# Patient Record
Sex: Female | Born: 1985 | Race: White | Hispanic: No | Smoking: Never smoker
Health system: Southern US, Community
[De-identification: ages and names within clinical notes are randomized; demographics above are authoritative.]

## PROBLEM LIST (undated history)

## (undated) DIAGNOSIS — M1990 Unspecified osteoarthritis, unspecified site: Secondary | ICD-10-CM

## (undated) DIAGNOSIS — J45909 Unspecified asthma, uncomplicated: Secondary | ICD-10-CM

## (undated) DIAGNOSIS — T7840XA Allergy, unspecified, initial encounter: Secondary | ICD-10-CM

## (undated) HISTORY — DX: Allergy, unspecified, initial encounter: T78.40XA

## (undated) HISTORY — DX: Unspecified asthma, uncomplicated: J45.909

## (undated) HISTORY — DX: Unspecified osteoarthritis, unspecified site: M19.90

---
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
## 2014-09-30 ENCOUNTER — Ambulatory Visit (INDEPENDENT_AMBULATORY_CARE_PROVIDER_SITE_OTHER): Payer: BLUE CROSS/BLUE SHIELD | Admitting: Family Medicine

## 2014-09-30 ENCOUNTER — Ambulatory Visit (INDEPENDENT_AMBULATORY_CARE_PROVIDER_SITE_OTHER): Payer: BLUE CROSS/BLUE SHIELD

## 2014-09-30 VITALS — BP 128/74 | HR 82 | Temp 98.1°F | Resp 16 | Ht 67.5 in | Wt 250.0 lb

## 2014-09-30 DIAGNOSIS — Z23 Encounter for immunization: Secondary | ICD-10-CM | POA: Diagnosis not present

## 2014-09-30 DIAGNOSIS — Z283 Underimmunization status: Secondary | ICD-10-CM | POA: Diagnosis not present

## 2014-09-30 DIAGNOSIS — S90411A Abrasion, right great toe, initial encounter: Secondary | ICD-10-CM

## 2014-09-30 DIAGNOSIS — S90111A Contusion of right great toe without damage to nail, initial encounter: Secondary | ICD-10-CM

## 2014-09-30 DIAGNOSIS — S90414A Abrasion, right lesser toe(s), initial encounter: Secondary | ICD-10-CM

## 2014-09-30 DIAGNOSIS — S90121A Contusion of right lesser toe(s) without damage to nail, initial encounter: Secondary | ICD-10-CM

## 2014-09-30 DIAGNOSIS — M79674 Pain in right toe(s): Secondary | ICD-10-CM

## 2014-09-30 DIAGNOSIS — Z2839 Other underimmunization status: Secondary | ICD-10-CM

## 2014-09-30 NOTE — Patient Instructions (Signed)
Keep wound covered with a Band-Aid. Return if any concern of infection.  You have received a Td AP vaccine today  Return if problems  Stay off work today.

## 2014-09-30 NOTE — Progress Notes (Signed)
  Subjective:  Patient ID: Donna Meyer, female    DOB: 02-19-1986  Age: 29 y.o. MRN: [id number]  Patient dropped a 35 pound bucket of litter on her right large toe last night. She had just gotten out of her car. It is painful and she has a hard time flexing the toe fully. It was fine before this. She does not know when her last tetanus vaccine was. Objective:   Small abrasion at the dorsum of the right first MTP joint. Skin was broken a tiny bit. Wound is clean. Toe is in line and looks okay. There is some bruising over the MTP she has a difficult time flexing the toe but does have some motion of it.  [facility] reading (PRIMARY) by  Dr. Alwyn Ren No fracture.    Assessment & Plan:   Assessment: Contusion and pain right great toe with small abrasion  Plan: There are no Patient Instructions on file for this visit.   HOPPER,DAVID, MD 09/30/2014

## 2014-11-25 ENCOUNTER — Ambulatory Visit (INDEPENDENT_AMBULATORY_CARE_PROVIDER_SITE_OTHER): Payer: BLUE CROSS/BLUE SHIELD | Admitting: Family Medicine

## 2014-11-25 VITALS — BP 116/82 | HR 71 | Temp 99.0°F | Resp 18 | Ht 68.0 in | Wt 251.0 lb

## 2014-11-25 DIAGNOSIS — R112 Nausea with vomiting, unspecified: Secondary | ICD-10-CM | POA: Diagnosis not present

## 2014-11-25 LAB — POCT CBC
Granulocyte percent: 70.6 %G (ref 37–80)
HCT, POC: 40.2 % (ref 37.7–47.9)
Hemoglobin: 12.7 g/dL (ref 12.2–16.2)
LYMPH, POC: 2.5 (ref 0.6–3.4)
MCH, POC: 25.6 pg — AB (ref 27–31.2)
MCHC: 31.5 g/dL — AB (ref 31.8–35.4)
MCV: 81.2 fL (ref 80–97)
MID (cbc): 0.5 (ref 0–0.9)
MPV: 7.2 fL (ref 0–99.8)
POC Granulocyte: 7.3 — AB (ref 2–6.9)
POC LYMPH %: 24.4 % (ref 10–50)
POC MID %: 5 %M (ref 0–12)
Platelet Count, POC: 409 10*3/uL (ref 142–424)
RBC: 4.95 M/uL (ref 4.04–5.48)
RDW, POC: 13.9 %
WBC: 10.3 10*3/uL — AB (ref 4.6–10.2)

## 2014-11-25 LAB — POCT UA - MICROSCOPIC ONLY
BACTERIA, U MICROSCOPIC: NEGATIVE
CASTS, UR, LPF, POC: NEGATIVE
Crystals, Ur, HPF, POC: NEGATIVE
Epithelial cells, urine per micros: NEGATIVE
RBC, urine, microscopic: NEGATIVE
WBC, Ur, HPF, POC: NEGATIVE
Yeast, UA: NEGATIVE

## 2014-11-25 LAB — POCT URINALYSIS DIPSTICK
Bilirubin, UA: NEGATIVE
GLUCOSE UA: NEGATIVE
KETONES UA: NEGATIVE
LEUKOCYTES UA: NEGATIVE
NITRITE UA: NEGATIVE
PH UA: 5.5
Protein, UA: NEGATIVE
Spec Grav, UA: 1.025
Urobilinogen, UA: 0.2

## 2014-11-25 LAB — POCT URINE PREGNANCY: PREG TEST UR: NEGATIVE

## 2014-11-25 MED ORDER — ONDANSETRON HCL 8 MG PO TABS: 8.0000 mg | ORAL_TABLET | Freq: Three times a day (TID) | ORAL | Status: DC | PRN

## 2014-11-25 NOTE — Progress Notes (Signed)
[facility] [address] [place] [address] [phone]  Date:  11/25/2014   Name:  Donna Meyer   DOB:  1985-11-11   MRN:  [id number]  PCP:  No PCP Per Patient    Chief Complaint: Nausea; Headache; Nasal Congestion; and Fatigue   History of Present Illness:  Donna Meyer is a 29 y.o. very pleasant female patient who presents with the following:  Generally healthy lady here today with complaint of illness for 2 days She has felt nauseated and vomited a couple of times She does not have any abd pain, but her body does ache  Just the thought or the smell of food makes her feel ill She has also noted an intermittent HA- yesterday it was worse, not severe today She has also felt tired, and has a ST No cough unless she has more PND Occasional sneeze She has her menses now- does not suspect pregnancy  She is a plumbing specialist at [employer] There are no active problems to display for this patient.   History reviewed. No pertinent past medical history.  Past Surgical History  Procedure Laterality Date  . Knee surgery Right 2004    Social History  Substance Use Topics  . Smoking status: Never Smoker   . Smokeless tobacco: None  . Alcohol Use: None    Family History  Problem Relation Age of Onset  . Cancer Mother   . Heart disease Father   . Cancer Maternal Grandmother   . Cancer Paternal Grandmother   . Cancer Paternal Grandfather   . Mental illness Paternal Grandfather     Allergies  Allergen Reactions  . Vicodin [Hydrocodone-Acetaminophen]     Medication list has been reviewed and updated.  No current outpatient prescriptions on file prior to visit.   No current facility-administered medications on file prior to visit.    Review of Systems:  As per HPI- otherwise negative.   Physical Examination: Filed Vitals:   11/25/14 1301  BP: 116/82  Pulse: 71  Temp: 99 F (37.2 C)  Resp: 18   Filed Vitals:   11/25/14  1301  Height:  (1.727 m)  Weight: 251 lb (113.853 kg)   Body mass index is 38.17 kg/(m^2). Ideal Body Weight: Weight in (lb) to have BMI = 25: 164.1  GEN: WDWN, NAD, Non-toxic, A & O x 3, overweight, looks well HEENT: Atraumatic, Normocephalic. Neck supple. No masses, No LAD.   Bilateral TM wnl, oropharynx normal.  PEERL,EOMI.   Ears and Nose: No external deformity. CV: RRR, No M/G/R. No JVD. No thrill. No extra heart sounds. PULM: CTA B, no wheezes, crackles, rhonchi. No retractions. No resp. distress. No accessory muscle use. ABD: S, ND, +BS. No rebound. No HSM.  Minimal lower abd tenderness across belly.  She had not noted any discomfort here until exam EXTR: No c/c/e NEURO Normal gait.  PSYCH: Normally interactive. Conversant. Not depressed or anxious appearing.  Calm demeanor.   Results for orders placed or performed in visit on 11/25/14  POCT CBC  Result Value Ref Range   WBC 10.3 (A) 4.6 - 10.2 K/uL   Lymph, poc 2.5 0.6 - 3.4   POC LYMPH PERCENT 24.4 10 - 50 %L   MID (cbc) 0.5 0 - 0.9   POC MID % 5.0 0 - 12 %M   POC Granulocyte 7.3 (A) 2 - 6.9   Granulocyte percent 70.6 37 - 80 %G   RBC 4.95 4.04 - 5.48 M/uL  Hemoglobin 12.7 12.2 - 16.2 g/dL   HCT, POC 16.1 09.6 - 47.9 %   MCV 81.2 80 - 97 fL   MCH, POC 25.6 (A) 27 - 31.2 pg   MCHC 31.5 (A) 31.8 - 35.4 g/dL   RDW, POC 04.5 %   Platelet Count, POC 409 142 - 424 K/uL   MPV 7.2 0 - 99.8 fL  POCT urine pregnancy  Result Value Ref Range   Preg Test, Ur Negative Negative  POCT UA - Microscopic Only  Result Value Ref Range   WBC, Ur, HPF, POC Negatvie    RBC, urine, microscopic Negative    Bacteria, U Microscopic Negatvie    Mucus, UA Trace    Epithelial cells, urine per micros Negative    Crystals, Ur, HPF, POC Negative    Casts, Ur, LPF, POC Negatvie    Yeast, UA Negative   POCT urinalysis dipstick  Result Value Ref Range   Color, UA Yellow    Clarity, UA Clear    Glucose, UA Negative    Bilirubin, UA  Negative    Ketones, UA Negatvie    Spec Grav, UA 1.025    Blood, UA Moderate    pH, UA 5.5    Protein, UA Negative    Urobilinogen, UA 0.2    Nitrite, UA Negatvie    Leukocytes, UA Negative Negative    Assessment and Plan: Non-intractable vomiting with nausea, vomiting of unspecified type - Plan: POCT CBC, POCT urine pregnancy, POCT UA - Microscopic Only, POCT urinalysis dipstick, ondansetron (ZOFRAN) 8 MG tablet  Here today with malaise, headache, nausea for about 2 days.  Likely non specificviral gastroenteritis.  Noted to have mild abd tenderness on exam- she had not noted any tenderness prior.  However she does endorse a history of painful ovarian cysts in the past  Counseled that it is possible that she has a more serious illness- appendicitis is even possible but do not strongly suspect this.  Offered to send for a CT scan now- she declines due to concern of radiation, but does agree to come back to clinic or go to the ER right away if she is getting worse or if not better soon  Signed Abbe Amsterdam, MD

## 2014-11-25 NOTE — Patient Instructions (Addendum)
Use the zofran as needed for nausea.  Stick to a bland diet  Let us know if you are not feeling better- if you do have any persistent pain please see [place] or go to the ER right away!

## 2015-07-19 ENCOUNTER — Ambulatory Visit (INDEPENDENT_AMBULATORY_CARE_PROVIDER_SITE_OTHER): Payer: BLUE CROSS/BLUE SHIELD | Admitting: Physician Assistant

## 2015-07-19 VITALS — BP 104/68 | HR 87 | Temp 98.4°F | Resp 16 | Ht 68.0 in | Wt 260.2 lb

## 2015-07-19 DIAGNOSIS — S93402A Sprain of unspecified ligament of left ankle, initial encounter: Secondary | ICD-10-CM | POA: Diagnosis not present

## 2015-07-19 NOTE — Patient Instructions (Addendum)
Rest, apply ice, elevate your foot Take ibuprofen three times a day Take off work the next 3 days. If symptoms are not improving in 1 week or if symptoms worsen at any time, return for follow up.    IF you received an x-ray today, you will receive an invoice from [facility]. Please contact [facility] at [phone] with questions or concerns regarding your invoice.   IF you received labwork today, you will receive an invoice from [facility]. Please contact [facility] at [phone] with questions or concerns regarding your invoice.   Our billing staff will not be able to assist you with questions regarding bills from these companies.  You will be contacted with the lab results as soon as they are available. The fastest way to get your results is to activate your My Chart account. Instructions are located on the last page of this paperwork. If you have not heard from us regarding the results in 2 weeks, please contact this office.

## 2015-07-19 NOTE — Progress Notes (Signed)
[facility] [address], [place] [address] [phone]  Date:  07/19/2015   Name:  Donna Meyer   DOB:  Mar 18, 1985   MRN:  [id number]  PCP:  No PCP Per Patient    Chief Complaint: Joint Swelling   History of Present Illness:  This is a 30 y.o. female who is presenting with 3 days of left ankle swelling. NKI. Started after she worked a 13 hour shift at [employer]. 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 Pain has worsened over the past day. She is limping a little. Feels like a sharp pain going from lateral side of ankle to medial side. She has no pain with rest, just walking. Having swelling at medial ankle. She has not tried anything for the pain. Pt states when she was in high school she had a "massive sprain" in her left ankle. She commonly sprains her ankle since that time. She states this is the first time she has had ankle swelling without an injury.  Review of Systems:  Review of Systems See HPI  There are no active problems to display for this patient.   Prior to Admission medications   Not on File    Allergies  Allergen Reactions  . Vicodin [Hydrocodone-Acetaminophen]     Past Surgical History  Procedure Laterality Date  . Knee surgery Right 2004    Social History  Substance Use Topics  . Smoking status: Never Smoker   . Smokeless tobacco: None  . Alcohol Use: None    Family History  Problem Relation Age of Onset  . Cancer Mother   . Heart disease Father   . Cancer Maternal Grandmother   . Cancer Paternal Grandmother   . Cancer Paternal Grandfather   . Mental illness Paternal Grandfather     Medication list has been reviewed and updated.  Physical Examination:  Physical Exam  Constitutional: She is oriented to person, place, and time. She appears well-developed and well-nourished. No distress.  HENT:  Head: Normocephalic and atraumatic.  Right Ear: Hearing normal.  Left Ear: Hearing normal.   Nose: Nose normal.  Eyes: Conjunctivae and lids are normal. Right eye exhibits no discharge. Left eye exhibits no discharge. No scleral icterus.  Cardiovascular: Normal rate, regular rhythm and normal pulses.   Pulmonary/Chest: Effort normal. No respiratory distress.  Musculoskeletal: Normal range of motion.       Left ankle: She exhibits swelling (mild, medial ankle). She exhibits normal range of motion, no ecchymosis and normal pulse. Tenderness (inferior to medial malleolus over soft tissue). No lateral malleolus, no head of 5th metatarsal and no proximal fibula tenderness found. Achilles tendon normal.  Cap refill intact Pedal pulses 2+ bilaterally  Neurological: She is alert and oriented to person, place, and time.  Skin: Skin is warm, dry and intact. No lesion and no rash noted.  Psychiatric: She has a normal mood and affect. Her speech is normal and behavior is normal. Thought content normal.   BP 104/68 mmHg  Pulse 87  Temp(Src) 98.4 F (36.9 C) (Oral)  Resp 16  Ht  (1.727 m)  Wt 260 lb 3.2 oz (118.026 kg)  BMI 39.57 kg/m2  SpO2 98%  LMP 06/26/2015  Assessment and Plan:  1. Sprain of ankle, left, initial encounter Suspect ankle sprain even though pt does not recall an injury. We discussed RICE therapy. She will stay out of work for the next 3 days. Return in 1  week if symptoms are not improving or at any time if symptoms worsen.   Roswell MinersNicole V. Dyke BrackettBush, PA-C, MHS [facility]  07/19/2015

## 2015-07-19 NOTE — Progress Notes (Deleted)
[facility] [address], [place] [address] [phone]  Date:  07/19/2015   Name:  Donna OverlieKatherine Meyer   DOB:  [birth year]   MRN:  [id number]  PCP:  No PCP Per Patient    Chief Complaint: Joint Swelling   History of Present Illness:  This is a 30 y.o. female who is presenting with left ankle swelling x 3 days.  Review of Systems:  Review of Systems See HPI   There are no active problems to display for this patient.   Prior to Admission medications   Not on File    Allergies  Allergen Reactions  . Vicodin [Hydrocodone-Acetaminophen]     Past Surgical History  Procedure Laterality Date  . Knee surgery Right 2004    Social History  Substance Use Topics  . Smoking status: Never Smoker   . Smokeless tobacco: None  . Alcohol Use: None    Family History  Problem Relation Age of Onset  . Cancer Mother   . Heart disease Father   . Cancer Maternal Grandmother   . Cancer Paternal Grandmother   . Cancer Paternal Grandfather   . Mental illness Paternal Grandfather     Medication list has been reviewed and updated.  Physical Examination:  Physical Exam  BP 104/68 mmHg  Pulse 87  Temp(Src) 98.4 F (36.9 C) (Oral)  Resp 16  Ht 5\' 8"  (1.727 m)  Wt 260 lb 3.2 oz (118.026 kg)  BMI 39.57 kg/m2  SpO2 98%  LMP 06/26/2015  Assessment and Plan:

## 2015-07-24 ENCOUNTER — Ambulatory Visit (INDEPENDENT_AMBULATORY_CARE_PROVIDER_SITE_OTHER): Payer: BLUE CROSS/BLUE SHIELD | Admitting: Physician Assistant

## 2015-07-24 ENCOUNTER — Ambulatory Visit (INDEPENDENT_AMBULATORY_CARE_PROVIDER_SITE_OTHER): Payer: BLUE CROSS/BLUE SHIELD

## 2015-07-24 VITALS — BP 120/80 | HR 95 | Temp 98.8°F | Resp 16 | Ht 68.0 in | Wt 263.0 lb

## 2015-07-24 DIAGNOSIS — S93402D Sprain of unspecified ligament of left ankle, subsequent encounter: Secondary | ICD-10-CM | POA: Diagnosis not present

## 2015-07-24 DIAGNOSIS — M25572 Pain in left ankle and joints of left foot: Secondary | ICD-10-CM | POA: Diagnosis not present

## 2015-07-24 NOTE — Patient Instructions (Addendum)
If still having a lot of pain in 1 week, come back    IF you received an x-ray today, you will receive an invoice from [facility]. Please contact [facility] at [phone] with questions or concerns regarding your invoice.   IF you received labwork today, you will receive an invoice from [facility]. Please contact [facility] at [phone] with questions or concerns regarding your invoice.   Our billing staff will not be able to assist you with questions regarding bills from these companies.  You will be contacted with the lab results as soon as they are available. The fastest way to get your results is to activate your My Chart account. Instructions are located on the last page of this paperwork. If you have not heard from us regarding the results in 2 weeks, please contact this office.

## 2015-07-24 NOTE — Progress Notes (Signed)
[facility] [address], [place] [address] [phone]  Date:  07/24/2015   Name:  Donna OverlieKatherine Hottinger   DOB:  Nov 12, 1985   MRN:  [id number]  PCP:  No PCP Per Patient    Chief Complaint: Follow-up   History of Present Illness:  This is a 30 y.o. female who is presenting for follow up left ankle pain. I saw pt on 5/13, at that time with 3 days of left medial ankle pain. She is prone to ankle sprains however NKI this time. She is a Merchandiser, retailsupervisor at [employer] and the day before the pain started she had worked a 13 hour shift -- states her whole shift she is walking and standing on concrete floor. Diagnosed with likely sprain. Took out of work for 3 days. She rested, iced, wrapped her ankle in an ace bandage, elevated her ankle. She felt like things were getting much better. Went back to work yesterday and as day went on pain started to worsen again. She took off work today.  States she just bought new heel inserts. She is flat footed. States she thinks her old ones were wearing down which causes her feet to turn outwards.  Review of Systems:  Review of Systems See HPI  There are no active problems to display for this patient.   Prior to Admission medications   Not on File    Allergies  Allergen Reactions  . Vicodin [Hydrocodone-Acetaminophen]     Past Surgical History  Procedure Laterality Date  . Knee surgery Right 2004    Social History  Substance Use Topics  . Smoking status: Never Smoker   . Smokeless tobacco: None  . Alcohol Use: None    Family History  Problem Relation Age of Onset  . Cancer Mother   . Heart disease Father   . Cancer Maternal Grandmother   . Cancer Paternal Grandmother   . Cancer Paternal Grandfather   . Mental illness Paternal Grandfather     Medication list has been reviewed and updated.  Physical Examination:  Physical Exam  Constitutional: She is oriented to person, place, and time. She appears well-developed and  well-nourished. No distress.  HENT:  Head: Normocephalic and atraumatic.  Right Ear: Hearing normal.  Left Ear: Hearing normal.  Nose: Nose normal.  Eyes: Conjunctivae and lids are normal. Right eye exhibits no discharge. Left eye exhibits no discharge. No scleral icterus.  Pulmonary/Chest: Effort normal. No respiratory distress.  Musculoskeletal: Normal range of motion.       Right ankle: Normal.       Left ankle: She exhibits swelling (medial ankle, inferior to malleolus). She exhibits normal range of motion, no ecchymosis and normal pulse. Tenderness (inferior to malleolus). No lateral malleolus, no medial malleolus, no head of 5th metatarsal and no proximal fibula tenderness found. Achilles tendon normal.  Neurological: She is alert and oriented to person, place, and time.  Skin: Skin is warm, dry and intact. No lesion and no rash noted.  Psychiatric: She has a normal mood and affect. Her speech is normal and behavior is normal. Thought content normal.   BP 120/80 mmHg  Pulse 95  Temp(Src) 98.8 F (37.1 C) (Oral)  Resp 16  Ht 5\' 8"  (1.727 m)  Wt 263 lb (119.296 kg)  BMI 40.00 kg/m2  SpO2 98%  LMP 06/26/2015  Dg Ankle Complete Left  07/24/2015  CLINICAL DATA:  30 year old female with pain and swelling at the medial ankle with no known injury.  Initial encounter. EXAM: LEFT ANKLE COMPLETE - 3+ VIEW COMPARISON:  None. FINDINGS: No evidence of joint effusion. Mortise joint alignment is maintained. Talar dome within normal limits. Medial malleolus intact. Distal tibia and fibula intact. Calcaneus intact. No acute osseous abnormality identified. IMPRESSION: No acute osseous abnormality identified about the left ankle. Electronically Signed   By: Odessa Fleming M.D.   On: 07/24/2015 14:57    Assessment and Plan:  1. Sprain of ankle, left, subsequent encounter 2. Ankle pain, left Radiograph negative. Still suspect this is an ankle sprain. She will continue with RICE therapy. I have given her a  work note so that she works a sitting job for the next week. She has crutches at home that she will use if needed to get around during the day. If symptoms still not improving in 1 week, return. Consider ortho referral at that time. - DG Ankle Complete Left; Future   [place] Miners. Dyke Brackett, MHS [facility]  07/24/2015

## 2016-01-09 ENCOUNTER — Encounter: Payer: Self-pay | Admitting: *Deleted

## 2016-01-09 ENCOUNTER — Telehealth: Payer: Self-pay

## 2016-01-09 ENCOUNTER — Ambulatory Visit (INDEPENDENT_AMBULATORY_CARE_PROVIDER_SITE_OTHER): Payer: BLUE CROSS/BLUE SHIELD | Admitting: Physician Assistant

## 2016-01-09 ENCOUNTER — Ambulatory Visit (INDEPENDENT_AMBULATORY_CARE_PROVIDER_SITE_OTHER): Payer: BLUE CROSS/BLUE SHIELD

## 2016-01-09 VITALS — BP 110/82 | HR 95 | Temp 98.7°F | Resp 16 | Ht 67.0 in | Wt 268.4 lb

## 2016-01-09 DIAGNOSIS — Z9889 Other specified postprocedural states: Secondary | ICD-10-CM

## 2016-01-09 DIAGNOSIS — M25561 Pain in right knee: Secondary | ICD-10-CM

## 2016-01-09 MED ORDER — MELOXICAM 7.5 MG PO TABS: 7.5000 mg | ORAL_TABLET | Freq: Every day | ORAL | 1 refills | Status: DC

## 2016-01-09 NOTE — Progress Notes (Signed)
Donna OverlieKatherine Meyer  MRN: [id number] DOB: 1985/05/30  PCP: No PCP Per Patient  Subjective:  Pt is a pleasant 30 year old female, history of right knee surgery 2003, who presents to clinic for right knee pain x 3 weeks.  Three weeks ago she was moving a refrigerator at [employer] where she works, she was twisting towards her left while right leg firmly planted on the ground when her right knee cap popped out of place towards the right side. It stayed like that for a few moments and popped back into place on it's own.    Today, however, she describes no pain or laxity of her knee cap, but complains of pain and swelling associated with her medial joint line of right knee. She is able to bear weight for about 20 minutes at a time until her knee is swollen and painful, also notes a "popping noise" when she walks. She also notes a few episodes of her "knee giving out".  Pain increases knee extension, located anteriorly. Sometimes she cannot bear weight due to pain in the front on her knee. Knee pain is present while sleeping if she moves it.   She has tried a knee brace and ace wrap, which she bought at [employer], neither of which relieve her pain.   Reports a history of right knee pain. Usually if her knee pain occurs, she stays off of it for a few days she is all better. However this is different becuase pain continues to be present despite rest and "the popping noise is new".   [place] - States she had a "hole in her thigh bone" and cartilage "was getting stuck in there". She had a plug placed, however it was too big but was told it would "wear down". Her knee has caused her problems since.    Of note she works at [employer] and is on her feet walking all day. Her knee occasionally gives out.   Review of Systems  Respiratory: Negative for chest tightness, shortness of breath and wheezing.   Cardiovascular: Negative for chest pain and palpitations.  Musculoskeletal:  Positive for arthralgias, gait problem and joint swelling. Negative for back pain and myalgias.  Skin: Negative.   Neurological: Negative for dizziness, weakness, light-headedness and headaches.    There are no active problems to display for this patient.   No current outpatient prescriptions on file prior to visit.   No current facility-administered medications on file prior to visit.     Allergies  Allergen Reactions  . Vicodin [Hydrocodone-Acetaminophen]     Objective:  BP 110/82   Pulse 95   Temp 98.7 F (37.1 C) (Oral)   Resp 16   Ht 5\' 7"  (1.702 m)   Wt 268 lb 6.4 oz (121.7 kg)   LMP 12/26/2015   SpO2 99%   BMI 42.04 kg/m   Physical Exam  Constitutional: She is oriented to person, place, and time and well-developed, well-nourished, and in no distress. No distress.  Cardiovascular: Normal rate, regular rhythm and normal heart sounds.   Musculoskeletal:       Right knee: She exhibits effusion. She exhibits normal range of motion, no deformity, normal alignment and normal patellar mobility. Tenderness found. Medial joint line tenderness noted. No lateral joint line, no MCL, no LCL and no patellar tendon tenderness noted.  Pain elicited at patellar tendon with engagement of the right quadriceps.   Neurological: She is alert and oriented to person, place,  and time. GCS score is 15.  Skin: Skin is warm and dry.  Psychiatric: Mood, memory, affect and judgment normal.  Vitals reviewed.  Dg Knee Complete 4 Views Right  Result Date: 01/09/2016 CLINICAL DATA:  Acute pain in the right knee. EXAM: RIGHT KNEE - COMPLETE 4+ VIEW COMPARISON:  None. FINDINGS: Mild tricompartment osteoarthritis is noted with joint space narrowing and marginal spur formation. There is no fracture or subluxation identified. IMPRESSION: 1. Osteoarthritis. 2. No acute findings. Electronically Signed   By: Signa Kellaylor  Stroud M.D.   On: 01/09/2016 08:53    Assessment and Plan :  1. Acute pain of right  knee 2. History of arthroscopy of right knee - DG Knee Complete 4 Views Right; Future - meloxicam (MOBIC) 7.5 MG tablet; Take 1 tablet (7.5 mg total) by mouth daily.  Dispense: 30 tablet; Refill: 1 - Ambulatory referral to Orthopedic Surgery - [facility]. - Work note writen: Light duty until work-up by orthopedics.   Marco CollieWhitney Imane Burrough, PA-C  [facility] 01/09/2016 8:18 AM

## 2016-01-09 NOTE — Patient Instructions (Addendum)
Mobic is an antiinflammatory medication, please use as needed for pain and swelling. You can take up to 15 mg/day. Play with the dosing schedule and see how you feel- you can take two at once, or one in the morning and one in the evening.   Orthopedics will call you to schedule an appointment.   Thank you for coming in today. I hope you feel we met your needs.  Feel free to call [facility] if you have any questions or further requests.  Please consider signing up for MyChart if you do not already have it, as this is a great way to communicate with me.  Best,  Whitney McVey, PA-C   IF you received an x-ray today, you will receive an invoice from [facility]. Please contact [facility] at [phone] with questions or concerns regarding your invoice.   IF you received labwork today, you will receive an invoice from [employer]. Please contact [facility] at [phone] with questions or concerns regarding your invoice.   Our billing staff will not be able to assist you with questions regarding bills from these companies.  You will be contacted with the lab results as soon as they are available. The fastest way to get your results is to activate your My Chart account. Instructions are located on the last page of this paperwork. If you have not heard from [place] regarding the results in 2 weeks, please contact this office.

## 2016-01-09 NOTE — Progress Notes (Deleted)
   Patient ID: Donna Meyer, female    DOB: [birth year], 30 y.o.   MRN: [id number]  PCP: No PCP Per Patient  Chief Complaint  Patient presents with  . right knee pain    x 3 wks    Subjective:   HPI Presents for evaluation of ***.  ***.    Review of Systems     There are no active problems to display for this patient.    Prior to Admission medications   Not on File     Allergies  Allergen Reactions  . Vicodin [Hydrocodone-Acetaminophen]        Objective:  Physical Exam         Assessment & Plan:   ***

## 2016-01-09 NOTE — Telephone Encounter (Signed)
Patient needs note OOW yesterday and today.    Also, needs x-ray for othro.  - sign release.

## 2016-01-10 NOTE — Telephone Encounter (Signed)
Ok to write out 11/3 and 11/4 for patient?  Xray request given to xray

## 2016-03-04 ENCOUNTER — Ambulatory Visit: Payer: BLUE CROSS/BLUE SHIELD | Attending: Family Medicine

## 2016-03-04 DIAGNOSIS — M25561 Pain in right knee: Secondary | ICD-10-CM | POA: Diagnosis not present

## 2016-03-04 DIAGNOSIS — R262 Difficulty in walking, not elsewhere classified: Secondary | ICD-10-CM | POA: Insufficient documentation

## 2016-03-04 DIAGNOSIS — M6281 Muscle weakness (generalized): Secondary | ICD-10-CM

## 2016-03-04 DIAGNOSIS — M1711 Unilateral primary osteoarthritis, right knee: Secondary | ICD-10-CM | POA: Diagnosis present

## 2016-03-04 DIAGNOSIS — M25661 Stiffness of right knee, not elsewhere classified: Secondary | ICD-10-CM | POA: Diagnosis present

## 2016-03-04 NOTE — Patient Instructions (Signed)
From cabinet QS and hip SLR 3 way not adduction 10-20 reps 2-3x/day 3-5 sec hold , extension stretch with QS and off table prone hang 2-5 min

## 2016-03-04 NOTE — Therapy (Signed)
[facility] [address] [place], [place], [address] Phone: [phone]   Fax:  [phone]  Physical Therapy Evaluation  Patient Details  Name: Donna Meyer MRN: [id number] Date of Birth: 1985-08-13 Referring Provider: Rodolph BongAdam Kendall ,MD  Encounter Date: 03/04/2016      PT End of Session - 03/04/16 1102    Visit Number 1   Number of Visits 12   Date for PT Re-Evaluation 04/16/16   Authorization Type BCBS   PT Start Time 1100   PT Stop Time 1144   PT Time Calculation (min) 44 min   Activity Tolerance Patient tolerated treatment well;No increased pain   Behavior During Therapy [facility] for tasks assessed/performed      No past medical history on file.  Past Surgical History:  Procedure Laterality Date  . KNEE SURGERY Right 2004    There were no vitals filed for this visit.       Subjective Assessment - 03/04/16 1106    Subjective She reports RT knee pain . She reports chronic pain but worse with incr pain ans swelling. She report minimal cartlidge in RT knee.  Began to worsen 01/2016. She reports dislocation RT patella. She was using brace for stablty of patella but brace does not stay in place so only wears occasionally.   She was doing walking/biking /swimming/elliptical  in 11/2015   Pertinent History 2005 surgery RT knee with scope due to locking of knee.    How long can you sit comfortably? 20 min   How long can you stand comfortably? 10   How long can you walk comfortably? 15-20 min   Diagnostic tests MRI:  X rays   Patient Stated Goals She wants to reduce pain to previous level (3-6/10) and return to previous activity   Currently in Pain? Yes   Pain Score 4    Pain Location Knee   Pain Orientation Right   Pain Descriptors / Indicators Aching;Throbbing;Sharp   Pain Type Acute pain  on chronic   Pain Onset More than a month ago   Pain Frequency Constant   Aggravating Factors  standing Elba Barman/walking /daily activity    Pain Relieving Factors heat/manipulate muscle and patlella / pillow under knee   Multiple Pain Sites No            [facility] PT Assessment - 03/04/16 0001      Assessment   Medical Diagnosis RT knee OA    Referring Provider Rodolph BongAdam Kendall ,MD   Onset Date/Surgical Date --  01/2016    Next MD Visit 03/17/16   Prior Therapy no     Precautions   Precautions None     Restrictions   Weight Bearing Restrictions No     Balance Screen   Has the patient fallen in the past 6 months Yes   How many times? 10  knee buckles   Has the patient had a decrease in activity level because of a fear of falling?  Yes   Is the patient reluctant to leave their home because of a fear of falling?  No     Prior Function   Level of Independence Independent     Cognition   Overall Cognitive Status Within Functional Limits for tasks assessed     ROM / Strength   AROM / PROM / Strength AROM;Strength     AROM   AROM Assessment Site Knee   Right/Left Knee Right;Left   Right Knee Extension -10   Right Knee Flexion  130   Left Knee Extension 0   Left Knee Flexion 135     Strength   Strength Assessment Site Knee;Hip   Right/Left Hip Left;Right   Right Hip Flexion 5/5   Right Hip Extension 4/5   Right Hip External Rotation  4+/5   Right Hip Internal Rotation 5/5   Right Hip ABduction 4/5   Right Hip ADduction 5/5   Left Hip Flexion 5/5   Left Hip Extension 4/5   Left Hip ABduction 5/5   Right/Left Knee Right;Left   Right Knee Flexion 5/5   Right Knee Extension --  5-/5   Left Knee Flexion 5/5   Left Knee Extension 5/5     Flexibility   Soft Tissue Assessment /Muscle Length yes   Hamstrings 70 degrees RT /LT      Palpation   Patella mobility patella lose bilaterally but not excessive . Tracking slight ly lateral but no more than LT today.      Ambulation/Gait   Gait Comments mild antalgic gait                           PT Education - 03/04/16 1143    Education  provided Yes   Education Details POC   Person(s) Educated Patient   Methods Explanation;Verbal cues;Handout   Comprehension Verbalized understanding;Returned demonstration          PT Short Term Goals - 03/04/16 1146      PT SHORT TERM GOAL #1   Title she wil be independent with intial HEP   Time 3   Period Weeks   Status New     PT SHORT TERM GOAL #2   Title She will report pain decrease 30% or more with activity on feet   Time 3   Period Weeks   Status New     PT SHORT TERM GOAL #3   Title She will return to gym for biking or nustep for conditioning and strength   Time 3   Period Weeks   Status New           PT Long Term Goals - 03/04/16 1207      PT LONG TERM GOAL #1   Title She will be independent with all exercises issued   Time 6   Period Weeks   Status New     PT LONG TERM GOAL #2   Title She will report return to pain levels prior to onset of increased pain.    Time 6   Period Weeks   Status New     PT LONG TERM GOAL #3   Title She will return to normal workout at lower levels of intensity   Time 6   Period Weeks   Status New     PT LONG TERM GOAL #4   Title She wil understand how to progress workout at appropriate  pace to not exacerbate her pain.    Time 6   Period Weeks   Status New     PT LONG TERM GOAL #5   Title she will return to work at previous level of pain and activity   Time 6   Period Weeks   Status New               Plan - 03/04/16 1143    Clinical Impression Statement Donna Meyer presents for low complexity eval with reports of RT knee pain , instability, selling , stiffness with  decr extension limiting ability to RTW and walk on level surfaces and stairs .    Rehab Potential Good   PT Frequency 2x / week   PT Duration 6 weeks   PT Treatment/Interventions Cryotherapy;Electrical Stimulation;Ultrasound;Passive range of motion;Patient/family education;Taping;Manual techniques;Therapeutic exercise;Therapeutic activities    PT Next Visit Plan Strengthening , Bike /nustep, modalities as needed  review and add to HEP   PT Home Exercise Plan QS , SLR for hip strenght and extension stretching   Consulted and Agree with Plan of Care Patient      Patient will benefit from skilled therapeutic intervention in order to improve the following deficits and impairments:  Pain, Decreased strength, Decreased range of motion, Decreased activity tolerance, Difficulty walking  Visit Diagnosis: Acute pain of right knee - Plan: PT plan of care cert/re-cert  Stiffness of right knee, not elsewhere classified - Plan: PT plan of care cert/re-cert  Difficulty in walking, not elsewhere classified - Plan: PT plan of care cert/re-cert  Muscle weakness (generalized) - Plan: PT plan of care cert/re-cert  Primary osteoarthritis of right knee - Plan: PT plan of care cert/re-cert     Problem List There are no active problems to display for this patient.   Caprice Red  PT 03/04/2016, 12:18 PM  [facility] [address] [place], [place], [address] Phone: [phone]   Fax:  [phone]  Name: Donna Meyer MRN: [id number] Date of Birth: 1985/04/05

## 2016-03-09 ENCOUNTER — Ambulatory Visit: Payer: BLUE CROSS/BLUE SHIELD | Attending: Family Medicine | Admitting: Physical Therapy

## 2016-03-09 DIAGNOSIS — M25561 Pain in right knee: Secondary | ICD-10-CM | POA: Diagnosis not present

## 2016-03-09 DIAGNOSIS — M1711 Unilateral primary osteoarthritis, right knee: Secondary | ICD-10-CM | POA: Diagnosis present

## 2016-03-09 DIAGNOSIS — R262 Difficulty in walking, not elsewhere classified: Secondary | ICD-10-CM

## 2016-03-09 DIAGNOSIS — M6281 Muscle weakness (generalized): Secondary | ICD-10-CM | POA: Insufficient documentation

## 2016-03-09 DIAGNOSIS — M25661 Stiffness of right knee, not elsewhere classified: Secondary | ICD-10-CM | POA: Insufficient documentation

## 2016-03-09 NOTE — Therapy (Signed)
[facility] [address] [place], [place], [address] Phone: [phone]   Fax:  [phone]  Physical Therapy Treatment  Patient Details  Name: Dayanne Yiu MRN: [id number] Date of Birth: [birth year] Referring Provider: Rodolph Bong ,MD  Encounter Date: 03/09/2016      PT End of Session - 03/09/16 1017    Visit Number 2   Number of Visits 12   Date for PT Re-Evaluation 04/16/16   Authorization Type BCBS   PT Start Time 0920   PT Stop Time 1020   PT Time Calculation (min) 60 min      No past medical history on file.  Past Surgical History:  Procedure Laterality Date  . KNEE SURGERY Right 2004    There were no vitals filed for this visit.      Subjective Assessment - 03/09/16 0925    Currently in Pain? Yes   Pain Score 1    Pain Location Knee   Pain Orientation Right                         [facility] Adult PT Treatment/Exercise - 03/09/16 0001      Knee/Hip Exercises: Stretches   Active Hamstring Stretch 3 reps;30 seconds   Active Hamstring Stretch Limitations strap     Knee/Hip Exercises: Aerobic   Stationary Bike Rec Bike L2 x 5 minutes      Knee/Hip Exercises: Supine   Quad Sets 15 reps   Quad Sets Limitations with foot on towel roll    Short Arc [employer] 20 reps   Bridges 10 reps   Bridges with [employer] 10 reps   Other Supine Knee/Hip Exercises adductor squeeze x 10    Other Supine Knee/Hip Exercises 3 way hip on mat to fatigue      Knee/Hip Exercises: Sidelying   Hip ABduction --                PT Education - 03/09/16 1017    Education provided Yes   Education Details HEP   Person(s) Educated Patient   Methods Explanation;Handout   Comprehension Verbalized understanding          PT Short Term Goals - 03/04/16 1146      PT SHORT TERM GOAL #1   Title she wil be independent with intial HEP   Time 3   Period Weeks   Status New     PT SHORT TERM GOAL #2   Title She will report pain decrease 30% or more with activity on feet   Time 3   Period Weeks   Status New     PT SHORT TERM GOAL #3   Title She will return to gym for biking or nustep for conditioning and strength   Time 3   Period Weeks   Status New           PT Long Term Goals - 03/04/16 1207      PT LONG TERM GOAL #1   Title She will be independent with all exercises issued   Time 6   Period Weeks   Status New     PT LONG TERM GOAL #2   Title She will report return to pain levels prior to onset of increased pain.    Time 6   Period Weeks   Status New     PT LONG TERM GOAL #3   Title She will return to normal workout at lower levels  of intensity   Time 6   Period Weeks   Status New     PT LONG TERM GOAL #4   Title She wil understand how to progress workout at appropriate  pace to not exacerbate her pain.    Time 6   Period Weeks   Status New     PT LONG TERM GOAL #5   Title she will return to work at previous level of pain and activity   Time 6   Period Weeks   Status New               Plan - 03/09/16 1020    Clinical Impression Statement Review of HEP, updated to include Bridges and adductor stretngth. Pt expresses interest in exercises she can do with physioball at home. She feels pulling in posterior knee with extension exercises. No increased pain at end of session.     PT Next Visit Plan Strengthening , Bike /nustep, modalities as needed  review and add to HEP   PT Home Exercise Plan QS , SLR for hip strenght and extension stretching, bridge, ball squeeze   Consulted and Agree with Plan of Care Patient      Patient will benefit from skilled therapeutic intervention in order to improve the following deficits and impairments:  Pain, Decreased strength, Decreased range of motion, Decreased activity tolerance, Difficulty walking  Visit Diagnosis: Acute pain of right knee  Stiffness of right knee, not elsewhere classified  Difficulty in  walking, not elsewhere classified  Muscle weakness (generalized)  Primary osteoarthritis of right knee     Problem List There are no active problems to display for this patient.   Sherrie MustacheDonoho, Anslee Micheletti McGee, [place] 03/09/2016, 10:21 AM  [facility] [address] [place], [place], [address] Phone: [phone]   Fax:  [phone]  Name: Marcelle OverlieKatherine Hellmer MRN: [id number] Date of Birth: 06/27/1985

## 2016-03-11 ENCOUNTER — Ambulatory Visit: Payer: BLUE CROSS/BLUE SHIELD | Admitting: Physical Therapy

## 2016-03-11 DIAGNOSIS — M6281 Muscle weakness (generalized): Secondary | ICD-10-CM

## 2016-03-11 DIAGNOSIS — M1711 Unilateral primary osteoarthritis, right knee: Secondary | ICD-10-CM

## 2016-03-11 DIAGNOSIS — M25561 Pain in right knee: Secondary | ICD-10-CM

## 2016-03-11 DIAGNOSIS — R262 Difficulty in walking, not elsewhere classified: Secondary | ICD-10-CM

## 2016-03-11 DIAGNOSIS — M25661 Stiffness of right knee, not elsewhere classified: Secondary | ICD-10-CM

## 2016-03-11 NOTE — Therapy (Signed)
[facility] [address] [place], [place], [address] Phone: [phone]   Fax:  [phone]  Physical Therapy Treatment  Patient Details  Name: Stefani Baik MRN: [id number] Date of Birth: [birth year] Referring Provider: Rodolph Bong ,MD  Encounter Date: 03/11/2016      PT End of Session - 03/11/16 0936    Visit Number 3   Number of Visits 12   Date for PT Re-Evaluation 04/16/16   Authorization Type BCBS   PT Start Time 0930   PT Stop Time 1030   PT Time Calculation (min) 60 min      No past medical history on file.  Past Surgical History:  Procedure Laterality Date  . KNEE SURGERY Right 2004    There were no vitals filed for this visit.      Subjective Assessment - 03/11/16 0937    Subjective Feels like I am getting a work out for my legs. My knee hurts after my exercises but heat makes it better.    Currently in Pain? No/denies                         [facility] Adult PT Treatment/Exercise - 03/11/16 0001      Knee/Hip Exercises: Stretches   Active Hamstring Stretch 3 reps;30 seconds   Active Hamstring Stretch Limitations strap     Knee/Hip Exercises: Aerobic   Stationary Bike Rec Bike L2 x 6 minutes      Knee/Hip Exercises: Standing   Heel Raises Both;2 sets;10 reps   Heel Raises Limitations single heel raises right x 8, left unlimited.      Knee/Hip Exercises: Seated   Long Arc Quad 20 reps;Weights   Long Arc Quad Weight 2 lbs.   Hamstring Curl 20 reps   Hamstring Limitations green band     Knee/Hip Exercises: Supine   Quad Sets 15 reps   Quad Sets Limitations with foot on towel   Short Arc Quad Sets 20 reps   Bridges 10 reps   Bridges with [employer] 10 reps   [place] with Clamshell 10 reps   Other Supine Knee/Hip Exercises adductor squeeze x 10    Other Supine Knee/Hip Exercises 4 way hip on mat to fatigue   2# except adduction                  PT Short Term  Goals - 03/04/16 1146      PT SHORT TERM GOAL #1   Title she wil be independent with intial HEP   Time 3   Period Weeks   Status New     PT SHORT TERM GOAL #2   Title She will report pain decrease 30% or more with activity on feet   Time 3   Period Weeks   Status New     PT SHORT TERM GOAL #3   Title She will return to gym for biking or nustep for conditioning and strength   Time 3   Period Weeks   Status New           PT Long Term Goals - 03/04/16 1207      PT LONG TERM GOAL #1   Title She will be independent with all exercises issued   Time 6   Period Weeks   Status New     PT LONG TERM GOAL #2   Title She will report return to pain levels prior to onset of increased pain.  Time 6   Period Weeks   Status New     PT LONG TERM GOAL #3   Title She will return to normal workout at lower levels of intensity   Time 6   Period Weeks   Status New     PT LONG TERM GOAL #4   Title She wil understand how to progress workout at appropriate  pace to not exacerbate her pain.    Time 6   Period Weeks   Status New     PT LONG TERM GOAL #5   Title she will return to work at previous level of pain and activity   Time 6   Period Weeks   Status New               Plan - 03/11/16 1025    Clinical Impression Statement Continued pulling and pain with full extension of right knee. Focused right knee strengthening. Visible atrophy in right calf compared to left. Instructed pt in heel raises for HEP to build calf muscles.    PT Next Visit Plan Strengthening , Bike /nustep, modalities as needed  review and add to HEP   PT Home Exercise Plan QS , SLR for hip strenght and extension stretching, bridge, ball squeeze   Consulted and Agree with Plan of Care Patient      Patient will benefit from skilled therapeutic intervention in order to improve the following deficits and impairments:  Pain, Decreased strength, Decreased range of motion, Decreased activity tolerance,  Difficulty walking  Visit Diagnosis: Acute pain of right knee  Stiffness of right knee, not elsewhere classified  Difficulty in walking, not elsewhere classified  Muscle weakness (generalized)  Primary osteoarthritis of right knee     Problem List There are no active problems to display for this patient.   Sherrie MustacheDonoho, Julliette Frentz McGee, [place]PTA 03/11/2016, 10:29 AM  [facility] [address] [place], [place], [address] Phone: [phone]   Fax:  [phone]  Name: Marcelle OverlieKatherine Coakley MRN: [id number] Date of Birth: [birth year]

## 2016-03-15 ENCOUNTER — Ambulatory Visit: Payer: BLUE CROSS/BLUE SHIELD

## 2016-03-15 DIAGNOSIS — M25561 Pain in right knee: Secondary | ICD-10-CM

## 2016-03-15 DIAGNOSIS — M6281 Muscle weakness (generalized): Secondary | ICD-10-CM

## 2016-03-15 DIAGNOSIS — M1711 Unilateral primary osteoarthritis, right knee: Secondary | ICD-10-CM

## 2016-03-15 DIAGNOSIS — M25661 Stiffness of right knee, not elsewhere classified: Secondary | ICD-10-CM

## 2016-03-15 NOTE — Patient Instructions (Signed)
AMBULATION: Side Step    Slide foot  On pillow case sideways. Repeat in opposite direction. _10-20__ reps per set, __1-2_ sets per day, _4-5__ days per week     Keep body over stance foot and only flex knee as comfortable. .  Copyright  VHI. All rights reserved.

## 2016-03-15 NOTE — Therapy (Signed)
[facility] [address] [place], [place], [address] Phone: [phone]   Fax:  [phone]  Physical Therapy Treatment  Patient Details  Name: Donna Meyer MRN: [id number] Date of Birth: [birth year] Referring Provider: Rodolph BongAdam Kendall ,MD  Encounter Date: 03/15/2016      PT End of Session - 03/15/16 0838    Visit Number 4   Number of Visits 12   Date for PT Re-Evaluation 04/16/16   Authorization Type BCBS   PT Start Time [phone]   PT Stop Time 0930   PT Time Calculation (min) 53 min   Activity Tolerance Patient tolerated treatment well;No increased pain   Behavior During Therapy [facility] for tasks assessed/performed      No past medical history on file.  Past Surgical History:  Procedure Laterality Date  . KNEE SURGERY Right 2004    There were no vitals filed for this visit.      Subjective Assessment - 03/15/16 0840    Subjective Doing better , thighs sore but this is from working muscles. Sore as day goes on but this is what is normal.    Currently in Pain? Yes   Pain Score 2    Pain Location Knee   Pain Orientation Right   Pain Descriptors / Indicators Aching;Throbbing;Sharp   Pain Type --  subacute   Pain Onset More than a month ago   Pain Frequency Intermittent   Aggravating Factors  stand /walk, ADl's   Pain Relieving Factors Heat/support under knee   Multiple Pain Sites No                         [facility] Adult PT Treatment/Exercise - 03/15/16 0844      Self-Care   Self-Care Other Self-Care Comments   Other Self-Care Comments  discussed supports for alighment and possible need for assessment of type of shoe that gives most support.  with arch support. insoles     Knee/Hip Exercises: Stretches   Gastroc Stretch 2 reps;30 seconds     Knee/Hip Exercises: Aerobic   Stationary Bike L3 7 min   Elliptical R 10 L 3    6  min     Knee/Hip Exercises: Standing   Heel Raises Both;10  reps;Right;Left  2x10 each R/L   Other Standing Knee Exercises slide RT then LT leg into abduction 2x10 reps eac foot on pilow case  cued to modify knee flexion as needed  for comfort and to not havfe knee go past toes on same leg     Knee/Hip Exercises: Supine   Bridges 10 reps   Bridges with Harley-DavidsonBall Squeeze 15 reps   [place] with [place] 15 reps;Both  blue band   Other Supine Knee/Hip Exercises 2 pound ankle  2 x 10 4 way SLR  cued for pelvic position                 PT Education - 03/15/16 0930    Education provided Yes   Education Details side foot slides RT and LT   Person(s) Educated Patient   Methods Explanation;Demonstration;Tactile cues;Verbal cues;Handout   Comprehension Returned demonstration;Verbalized understanding          PT Short Term Goals - 03/15/16 [id number]      PT SHORT TERM GOAL #1   Title she wil be independent with intial HEP   Status Achieved     PT SHORT TERM GOAL #2   Title She will report pain decrease  30% or more with activity on feet   Status Achieved     PT SHORT TERM GOAL #3   Title She will return to gym for biking or nustep for conditioning and strength   Status On-going           PT Long Term Goals - 03/04/16 1207      PT LONG TERM GOAL #1   Title She will be independent with all exercises issued   Time 6   Period Weeks   Status New     PT LONG TERM GOAL #2   Title She will report return to pain levels prior to onset of increased pain.    Time 6   Period Weeks   Status New     PT LONG TERM GOAL #3   Title She will return to normal workout at lower levels of intensity   Time 6   Period Weeks   Status New     PT LONG TERM GOAL #4   Title She wil understand how to progress workout at appropriate  pace to not exacerbate her pain.    Time 6   Period Weeks   Status New     PT LONG TERM GOAL #5   Title she will return to work at previous level of pain and activity   Time 6   Period Weeks   Status New                Plan - 03/15/16 1610    Clinical Impression Statement Cont to report muscle soreness but knee pain no worse. She is working to [employer] and may return to gym for short periods and she was cautioned to start slow with return to use of equipment and gym   PT Treatment/Interventions Cryotherapy;Electrical Stimulation;Ultrasound;Passive range of motion;Patient/family education;Taping;Manual techniques;Therapeutic exercise;Therapeutic activities   PT Next Visit Plan Strengthening , Bike /nustep, modalities as needed  review and add to HEP   PT Home Exercise Plan QS , SLR for hip strength and extension stretching, bridge, ball squeeze      Patient will benefit from skilled therapeutic intervention in order to improve the following deficits and impairments:  Pain, Decreased strength, Decreased range of motion, Decreased activity tolerance, Difficulty walking  Visit Diagnosis: Primary osteoarthritis of right knee  Stiffness of right knee, not elsewhere classified  Muscle weakness (generalized)  Acute pain of right knee     Problem List There are no active problems to display for this patient.   Caprice Red  PT 03/15/2016, 9:32 AM  [facility] [address] [place], [place], [address] Phone: [phone]   Fax:  [phone]  Name: Donna Meyer MRN: [id number] Date of Birth: 1985/06/09

## 2016-03-17 ENCOUNTER — Ambulatory Visit: Payer: BLUE CROSS/BLUE SHIELD | Admitting: Physical Therapy

## 2016-03-17 DIAGNOSIS — M25561 Pain in right knee: Secondary | ICD-10-CM | POA: Diagnosis not present

## 2016-03-17 DIAGNOSIS — M25661 Stiffness of right knee, not elsewhere classified: Secondary | ICD-10-CM

## 2016-03-17 DIAGNOSIS — M1711 Unilateral primary osteoarthritis, right knee: Secondary | ICD-10-CM

## 2016-03-17 DIAGNOSIS — R262 Difficulty in walking, not elsewhere classified: Secondary | ICD-10-CM

## 2016-03-17 DIAGNOSIS — M6281 Muscle weakness (generalized): Secondary | ICD-10-CM

## 2016-03-17 NOTE — Patient Instructions (Signed)
From ex drawer: 1 x a day, 3x each,  30 seconds hold Calf and hamstring stretches

## 2016-03-17 NOTE — Therapy (Signed)
[place], [place], [address] Phone: [phone]   Fax:  [phone]  Physical Therapy Treatment  Patient Details  Name: Donna Meyer MRN: [id number] Date of Birth: 1985-10-02 Referring Provider: Vickki Hearing ,MD  Encounter Date: 03/17/2016      PT End of Session - 03/17/16 1814    Visit Number 4   Number of Visits 12   Date for PT Re-Evaluation 04/16/16   PT Start Time 1330   PT Stop Time 1415   PT Time Calculation (min) 45 min   Activity Tolerance Patient tolerated treatment well   Behavior During Therapy [facility] for tasks assessed/performed      No past medical history on file.  Past Surgical History:  Procedure Laterality Date  . KNEE SURGERY Right 2004    There were no vitals filed for this visit.      Subjective Assessment - 03/17/16 1328    Subjective Saw MD today.  My knee is sore from the exam.  She is to continue with PT and return in a month to see MD.  She is to work on Hotel manager.  She has been wearing shoe inserts for over a year now.  She likes to go barefoot in the home. She plans to got to gym tomorrow morning.    Currently in Pain? Yes   Pain Score 3    Pain Location Knee   Pain Orientation Right   Pain Descriptors / Indicators Sore   Pain Frequency Intermittent   Aggravating Factors  standing, exercises standing, ADL's,  weight bearing   Pain Relieving Factors heat, pillow for support   Effect of Pain on Daily Activities Not back to hiking                         [facility] Adult PT Treatment/Exercise - 03/17/16 0001      Self-Care   Self-Care RICE;Heat/Ice Application   Heat/Ice Application suggested heat prior to hamstring stretching,  gentle stretches   Other Self-Care Comments  aquatic exercise suggestions.   wear aqua shoes.     Knee/Hip Exercises: Stretches   Passive Hamstring Stretch 3 reps;30 seconds   Gastroc Stretch 3 reps;30 seconds   Gastroc Stretch Limitations incline board     Knee/Hip Exercises: Aerobic   Stationary Bike L3 8 minutes   Elliptical 6 minutes, ramp 10 , L3     Knee/Hip Exercises: Standing   Heel Raises 10 reps   Heel Raises Limitations single 10 x each  tip toe walking 20 + feet.   SLS 3 ways, less pain with 45 degree angle   SLS with Vectors single leg stand with sliding pillowcase  2 positions,  inner knee pain builds   Other Standing Knee Exercises tr=erminal knee extension at wall with yellow ball press into wall 10 x 5 seconds, cues initially.   Other Standing Knee Exercises slight squat with small side steps, cues to keep feet forward 5 sets of 2 steps.      Knee/Hip Exercises: Seated   Hamstring Curl 20 reps   Hamstring Limitations green band     Knee/Hip Exercises: Supine   Bridges 15 reps   Bridges with [employer] 10 reps   Patellar Mobs non tight, floating   Other Supine Knee/Hip Exercises abdominal braced with yellow ball squeeze.                PT Education - 03/17/16 1820  Education provided Yes   Education Details HEP   Person(s) Educated Patient   Methods Explanation;Demonstration;Verbal cues;Handout   Comprehension Verbalized understanding;Returned demonstration          PT Short Term Goals - 03/15/16 7494      PT SHORT TERM GOAL #1   Title she wil be independent with intial HEP   Status Achieved     PT SHORT TERM GOAL #2   Title She will report pain decrease 30% or more with activity on feet   Status Achieved     PT SHORT TERM GOAL #3   Title She will return to gym for biking or nustep for conditioning and strength   Status On-going           PT Long Term Goals - 03/04/16 1207      PT LONG TERM GOAL #1   Title She will be independent with all exercises issued   Time 6   Period Weeks   Status New     PT LONG TERM GOAL #2   Title She will report return to pain levels prior to onset of increased pain.    Time 6   Period Weeks   Status  New     PT LONG TERM GOAL #3   Title She will return to normal workout at lower levels of intensity   Time 6   Period Weeks   Status New     PT LONG TERM GOAL #4   Title She wil understand how to progress workout at appropriate  pace to not exacerbate her pain.    Time 6   Period Weeks   Status New     PT LONG TERM GOAL #5   Title she will return to work at previous level of pain and activity   Time 6   Period Weeks   Status New               Plan - 03/17/16 1815    Clinical Impression Statement ROM not formally measured but appears less than -10 degrees.  Patient had self care questions answered today about exercise in water, aquatic shoes, use of heat and colr, return to gym etc.   Knee sore intermittantly during session. Care was taken to avoid flaring Knee pain.   She has not returned to the gym , but plans on going tomorrow.  No new goals met    PT Next Visit Plan Strengthening , Bike /nustep, modalities as needed  review and add to HEP   PT Home Exercise Plan QS , SLR for hip strength and extension stretching, bridge, ball squeeze, ankle / hamstring stretching   Consulted and Agree with Plan of Care Patient      Patient will benefit from skilled therapeutic intervention in order to improve the following deficits and impairments:  Pain, Decreased strength, Decreased range of motion, Decreased activity tolerance, Difficulty walking  Visit Diagnosis: Primary osteoarthritis of right knee  Stiffness of right knee, not elsewhere classified  Muscle weakness (generalized)  Acute pain of right knee  Difficulty in walking, not elsewhere classified     Problem List There are no active problems to display for this patient.   Donna Meyer PTA 03/17/2016, 6:22 PM  [facility] [address] [place], [place], [address] Phone: [phone]   Fax:  [phone]  Name: Donna Meyer MRN: [id number] Date of Birth:  1985-11-18

## 2016-03-22 ENCOUNTER — Ambulatory Visit: Payer: BLUE CROSS/BLUE SHIELD

## 2016-03-22 DIAGNOSIS — M25561 Pain in right knee: Secondary | ICD-10-CM | POA: Diagnosis not present

## 2016-03-22 DIAGNOSIS — M25661 Stiffness of right knee, not elsewhere classified: Secondary | ICD-10-CM

## 2016-03-22 DIAGNOSIS — M1711 Unilateral primary osteoarthritis, right knee: Secondary | ICD-10-CM

## 2016-03-22 DIAGNOSIS — R262 Difficulty in walking, not elsewhere classified: Secondary | ICD-10-CM

## 2016-03-22 DIAGNOSIS — M6281 Muscle weakness (generalized): Secondary | ICD-10-CM

## 2016-03-22 NOTE — Therapy (Addendum)
[place], [place], [address] Phone: [phone]   Fax:  [phone]  Physical Therapy Treatment/Discharge  Patient Details  Name: Donna Meyer MRN: [id number] Date of Birth: [birth year] Referring Provider: Vickki Hearing ,MD  Encounter Date: 03/22/2016      PT End of Session - 03/22/16 0804    Visit Number 5   Number of Visits 12   Date for PT Re-Evaluation 04/16/16   Authorization Type [employer]   PT Start Time 0800   PT Stop Time 0850   PT Time Calculation (min) 50 min   Activity Tolerance Patient tolerated treatment well   Behavior During Therapy [facility] for tasks assessed/performed      No past medical history on file.  Past Surgical History:  Procedure Laterality Date  . KNEE SURGERY Right 2004    There were no vitals filed for this visit.      Subjective Assessment - 03/22/16 0805    Subjective No pain with walking and only mild with sitting . Does not like prolonged postions   Currently in Pain? No/denies                         [facility] Adult PT Treatment/Exercise - 03/22/16 0001      Knee/Hip Exercises: Aerobic   Stationary Bike L3 6 minutes     Knee/Hip Exercises: Standing   Heel Raises 15 reps;Both   Other Standing Knee Exercises slight squat with small side steps RT and LT x 8 reps cued to lightly touch foot before weight shift to leg      Knee/Hip Exercises: Supine   Bridges 15 reps   Bridges with [employer] 15 reps   [place] with Clamshell 15 reps;Both   Other Supine Knee/Hip Exercises hamstring stretch 60 sec RT/LT     Modalities   Modalities Moist Heat     Moist Heat Therapy   Number Minutes Moist Heat 12 Minutes   Moist Heat Location Knee  R     Side and forward stepping with pausing with light touch with no weight bearing leg then controlled transfer x10  Rt and LT              PT Short Term Goals - 03/22/16 6761      PT SHORT TERM GOAL  #1   Title she wil be independent with intial HEP   Status Achieved     PT SHORT TERM GOAL #2   Title She will report pain decrease 30% or more with activity on feet   Status Achieved     PT SHORT TERM GOAL #3   Title She will return to gym for biking or nustep for conditioning and strength   Status Achieved           PT Long Term Goals - 03/22/16 0839      PT LONG TERM GOAL #1   Title She will be independent with all exercises issued   Status On-going     PT LONG TERM GOAL #2   Title She will report return to pain levels prior to onset of increased pain.    Status On-going     PT LONG TERM GOAL #3   Title She will return to normal workout at lower levels of intensity   Status On-going     PT LONG TERM GOAL #4   Title She wil understand how to progress workout at appropriate  pace to  not exacerbate her pain.    Status On-going     PT LONG TERM GOAL #5   Title she will return to work at previous level of pain and activity   Status On-going               Plan - 03/22/16 2130    Clinical Impression Statement mild soreness after session treated with MHP . We will limit weight bearing activity with pain as guide. She has begun gym activity but was sore and recognized she did too much so will ease back .  Progress exercise as pain allows   PT Treatment/Interventions Cryotherapy;Electrical Stimulation;Ultrasound;Passive range of motion;Patient/family education;Taping;Manual techniques;Therapeutic exercise;Therapeutic activities   PT Next Visit Plan Strengthening , Bike /nustep, modalities as needed  review and add to HEP   PT Home Exercise Plan QS , SLR for hip strength and extension stretching, bridge, ball squeeze, ankle / hamstring stretching   Consulted and Agree with Plan of Care Patient      Patient will benefit from skilled therapeutic intervention in order to improve the following deficits and impairments:  Pain, Decreased strength, Decreased range of motion,  Decreased activity tolerance, Difficulty walking  Visit Diagnosis: Primary osteoarthritis of right knee  Stiffness of right knee, not elsewhere classified  Muscle weakness (generalized)  Acute pain of right knee  Difficulty in walking, not elsewhere classified     Problem List There are no active problems to display for this patient.   Darrel Hoover  PT 03/22/2016, 8:41 AM  [facility] [address] [place], [place], [address] Phone: [phone]   Fax:  [phone]  Name: Donna Meyer MRN: [id number] Date of Birth: [birth year]  PHYSICAL THERAPY DISCHARGE SUMMARY  Visits from Start of Care: 5  Current functional level related to goals / functional outcomes: See above . Last 2 visits canceled by provider and pt;   Remaining deficits: unknown   Education / Equipment: HEP Plan:                                                    Patient goals were partially met. Patient is being discharged due to not returning since the last visit.  ?????    Darrel Hoover, PT  05/18/16     12:07  PM

## 2016-03-30 ENCOUNTER — Ambulatory Visit: Payer: BLUE CROSS/BLUE SHIELD

## 2016-04-01 ENCOUNTER — Ambulatory Visit: Payer: BLUE CROSS/BLUE SHIELD

## 2016-06-21 DIAGNOSIS — M25361 Other instability, right knee: Secondary | ICD-10-CM | POA: Diagnosis not present

## 2016-06-21 DIAGNOSIS — G8929 Other chronic pain: Secondary | ICD-10-CM | POA: Diagnosis not present

## 2016-06-21 DIAGNOSIS — M1711 Unilateral primary osteoarthritis, right knee: Secondary | ICD-10-CM | POA: Diagnosis not present

## 2016-06-21 DIAGNOSIS — M25561 Pain in right knee: Secondary | ICD-10-CM | POA: Diagnosis not present

## 2017-03-18 IMAGING — CR DG ANKLE COMPLETE 3+V*L*
4 series · 4 of 4 positions shown · non-contrast
Comparison: None.

CLINICAL DATA: 29-year-old female with pain and swelling at the
medial ankle with no known injury. Initial encounter.

EXAM:
LEFT ANKLE COMPLETE - 3+ VIEW

[AP]
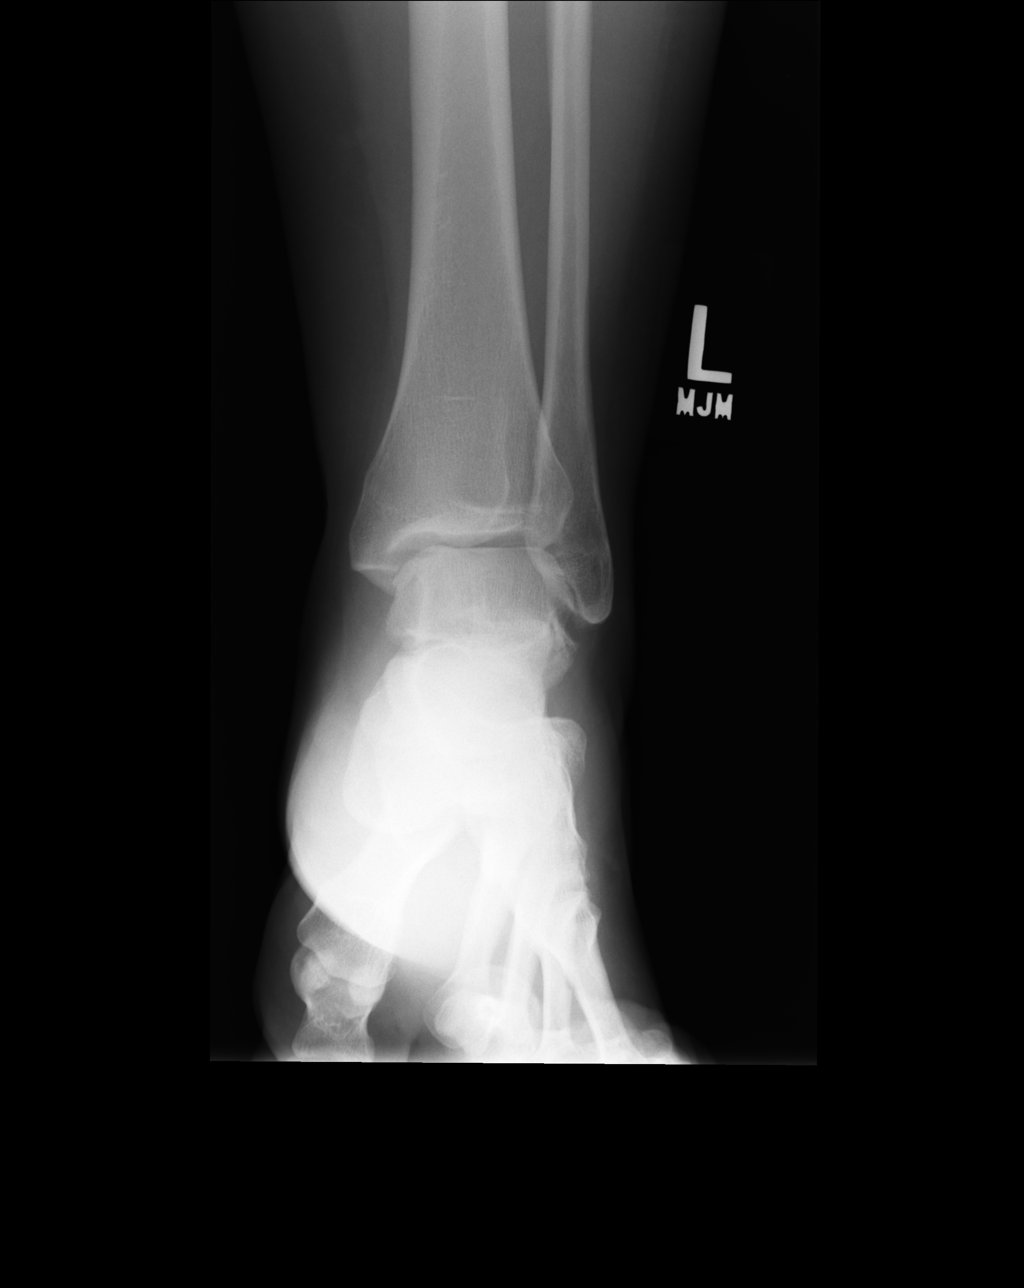

[ap obl int rot]
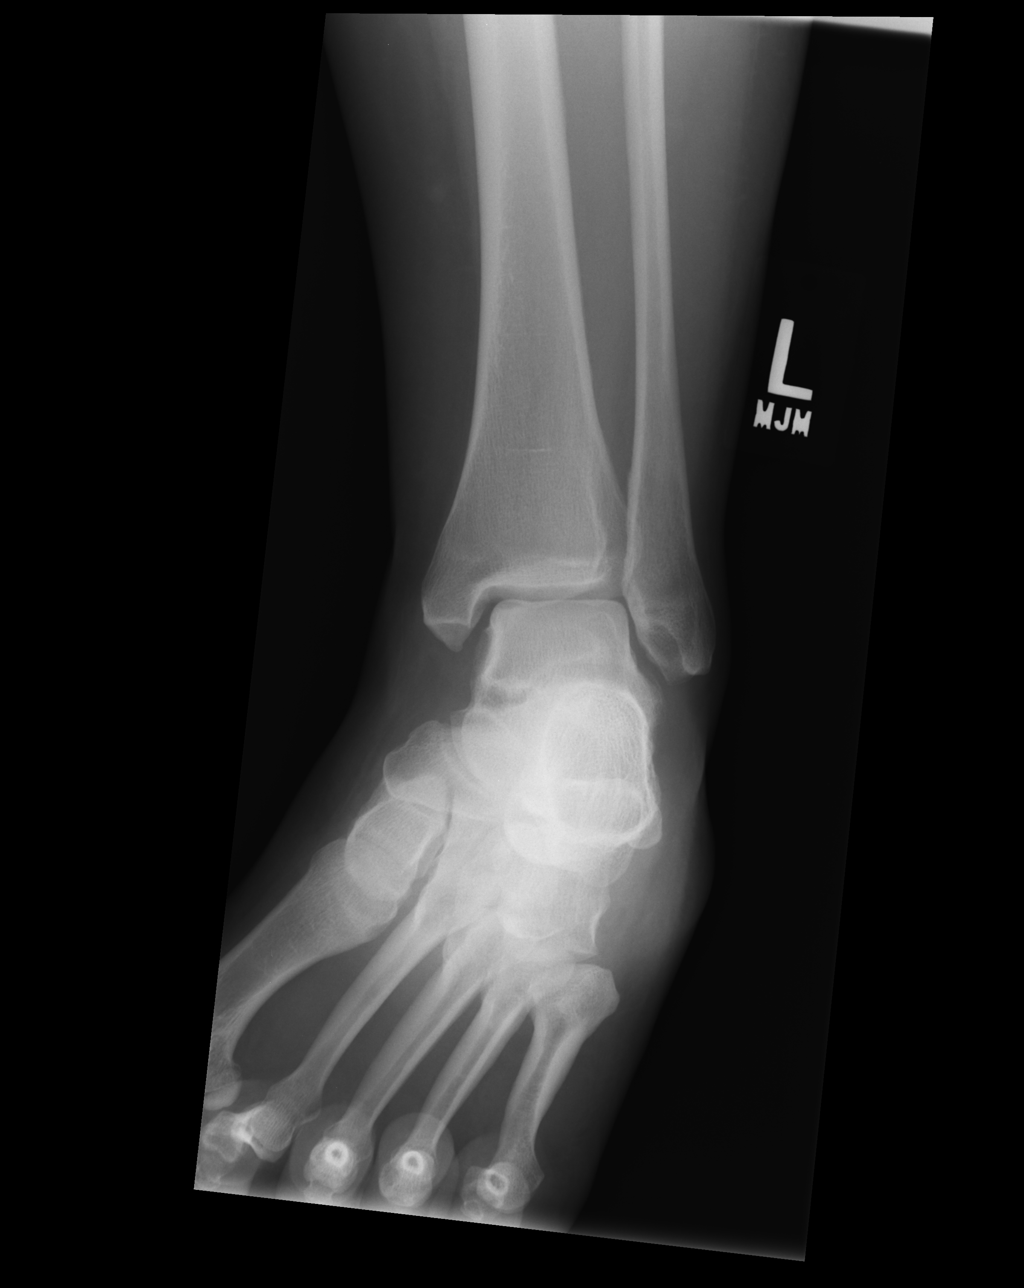

[ap obl ext rot]
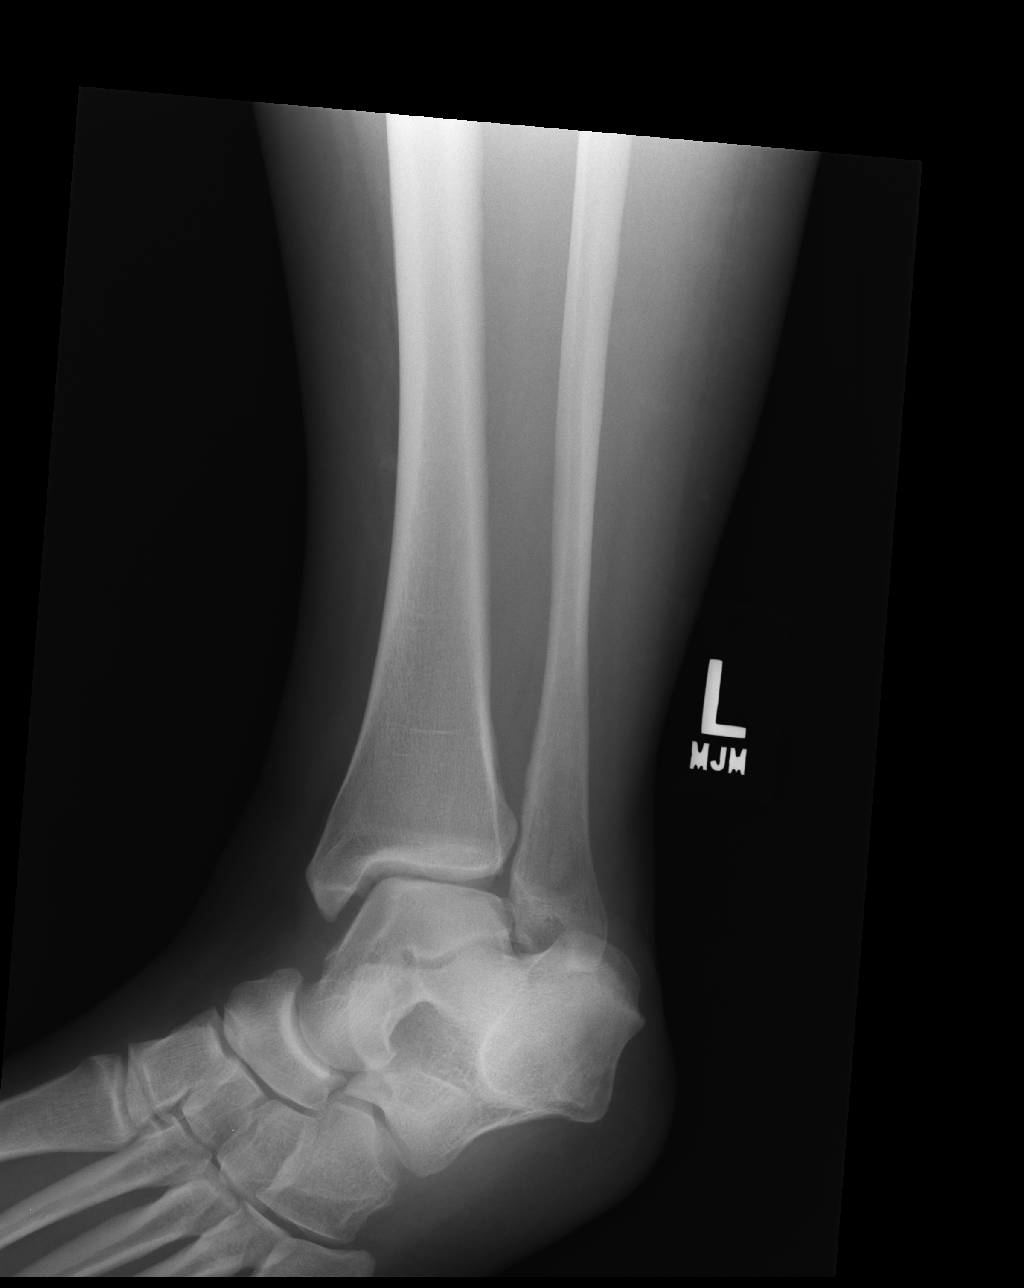

[lateral]
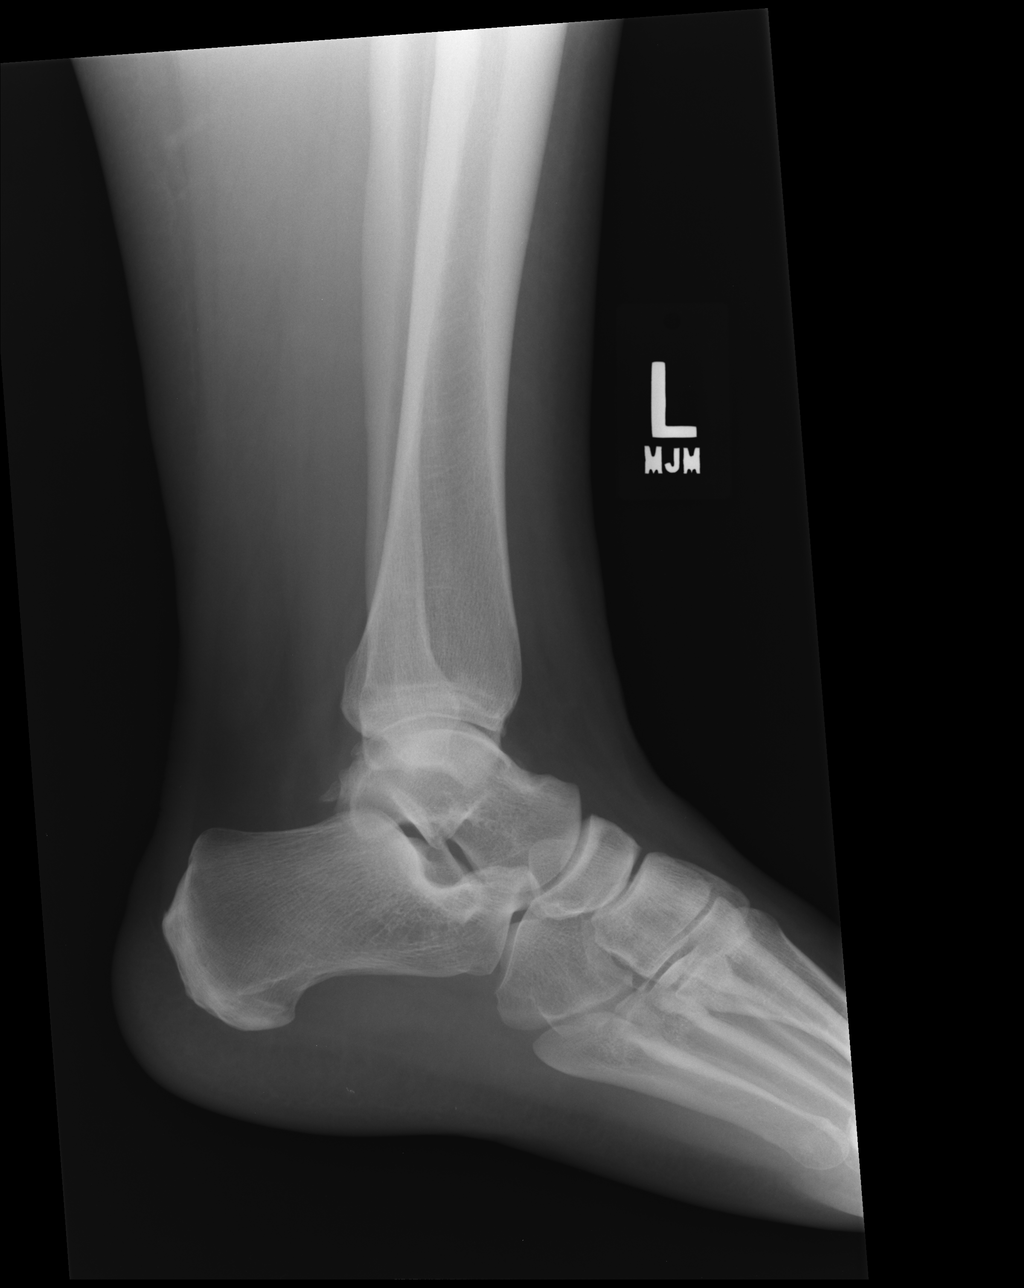

[4 of 4 positions shown; findings below may reference images not displayed]

FINDINGS: No evidence of joint effusion. Mortise joint alignment is
maintained. Talar dome within normal limits. Medial malleolus
intact. Distal tibia and fibula intact. Calcaneus intact. No acute
osseous abnormality identified.
IMPRESSION: No acute osseous abnormality identified about the left ankle.

## 2018-05-10 DIAGNOSIS — R0602 Shortness of breath: Secondary | ICD-10-CM | POA: Diagnosis not present

## 2019-01-05 DIAGNOSIS — Z20828 Contact with and (suspected) exposure to other viral communicable diseases: Secondary | ICD-10-CM | POA: Diagnosis not present

## 2019-01-12 DIAGNOSIS — H66002 Acute suppurative otitis media without spontaneous rupture of ear drum, left ear: Secondary | ICD-10-CM | POA: Diagnosis not present

## 2019-01-12 DIAGNOSIS — R509 Fever, unspecified: Secondary | ICD-10-CM | POA: Diagnosis not present

## 2019-01-12 DIAGNOSIS — J029 Acute pharyngitis, unspecified: Secondary | ICD-10-CM | POA: Diagnosis not present

## 2019-01-13 DIAGNOSIS — Z20828 Contact with and (suspected) exposure to other viral communicable diseases: Secondary | ICD-10-CM | POA: Diagnosis not present

## 2019-03-26 DIAGNOSIS — Z20828 Contact with and (suspected) exposure to other viral communicable diseases: Secondary | ICD-10-CM | POA: Diagnosis not present

## 2020-09-30 DIAGNOSIS — S93402A Sprain of unspecified ligament of left ankle, initial encounter: Secondary | ICD-10-CM | POA: Diagnosis not present

## 2020-10-08 DIAGNOSIS — M25572 Pain in left ankle and joints of left foot: Secondary | ICD-10-CM | POA: Diagnosis not present

## 2020-10-24 DIAGNOSIS — M25572 Pain in left ankle and joints of left foot: Secondary | ICD-10-CM | POA: Diagnosis not present

## 2021-01-30 DIAGNOSIS — Z20822 Contact with and (suspected) exposure to covid-19: Secondary | ICD-10-CM | POA: Diagnosis not present

## 2021-10-16 LAB — LIPID PANEL
Cholesterol: 255 — AB (ref 0–200)
HDL: 54 (ref 35–70)
LDL Cholesterol: 179
Triglycerides: 110 (ref 40–160)

## 2021-10-16 LAB — HEMOGLOBIN A1C: Hemoglobin A1C: 6

## 2021-10-29 NOTE — Progress Notes (Signed)
New Patient Visit  BP 132/80 (BP Location: Left Arm, Patient Position: Sitting, Cuff Size: Large)   Pulse 88   Temp 97.8 F (36.6 C) (Temporal)   Ht 5\' 7"  (1.702 m)   Wt (!) 317 lb (143.8 kg)   SpO2 97%   BMI 49.65 kg/m    Subjective:    Patient ID: Donna Meyer, female    DOB: [birth year], 36 y.o.   MRN: [id number]  CC: Chief Complaint  Patient presents with   Establish Care    NP/establish care discuss possible diabetes and cholesterol. Patient not fasting.     HPI: Donna Meyer is a 36 y.o. female presents for new patient visit to establish care.  Introduced to 31 role and practice setting.  All questions answered.  Discussed provider/patient relationship and expectations.  She had test results done at work and were concerning.  She was found to be prediabetic with an A1c of 6% and her total cholesterol and LDL cholesterol are elevated.  She states that her work has a Publishing rights manager for prediabetics which allow them to meet with a nutritionist, set goals, track food and weight yourself.  She started this after getting her lab results a few weeks ago and has already lost 5 pounds.  She is looking to make nutritional changes and increasing exercise.  She has an elliptical that she is going to start using again.  She does endorse some shortness of breath with exertion, however she denies chest pain.  She has a history of arthritis in her right knee since she was a teenager.  She had surgery on her knee, however it was unsuccessful and she is missing cartilage on her knee.  She notes that it causes pain if she is on it for long periods of time and can lock up at times.  She is not currently taking anything for pain.  Overall this is stable.  She has a history of anxiety and is looking into a therapist through work.  She states that since she found out she was prediabetic and had chronic cholesterol, she has been worried about her health.  She denies panic attacks.  She  denies SI/HI.     10/30/2021    8:15 AM 01/09/2016    8:12 AM 07/24/2015    2:03 PM 07/19/2015   12:10 PM 11/25/2014    1:02 PM  Depression screen PHQ 2/9  Decreased Interest 1 0 0 0 0  Down, Depressed, Hopeless 1 0 0 0 0  PHQ - 2 Score 2 0 0 0 0  Altered sleeping 1      Tired, decreased energy 1      Change in appetite 1      Feeling bad or failure about yourself  1      Trouble concentrating 0      Moving slowly or fidgety/restless 0      Suicidal thoughts 0      PHQ-9 Score 6      Difficult doing work/chores Somewhat difficult          10/30/2021    9:17 AM  GAD 7 : Generalized Anxiety Score  Nervous, Anxious, on Edge 1  Control/stop worrying 0  Worry too much - different things 0  Trouble relaxing 1  Restless 0  Easily annoyed or irritable 1  Afraid - awful might happen 1  Total GAD 7 Score 4  Anxiety Difficulty Somewhat difficult    Past Medical History:  Diagnosis Date  Allergy    Since i was a child   Arthritis    Asthma    as a child    Past Surgical History:  Procedure Laterality Date   KNEE SURGERY Right 03/08/2002    Family History  Problem Relation Age of Onset   Hyperlipidemia Mother    Cancer Mother        skin   Hypothyroidism Mother    Heart disease Father    Cancer Father        oral   Hyperlipidemia Sister    Heart disease Sister    ADD / ADHD Brother    Hyperlipidemia Maternal Aunt    Heart disease Maternal Aunt    Hyperlipidemia Maternal Grandmother    Cancer Maternal Grandmother        unknown type   Heart disease Maternal Grandfather    Heart attack Maternal Grandfather    Cancer Paternal Grandmother        colon   Dementia Paternal Grandfather      Social History   Tobacco Use   Smoking status: Never   Smokeless tobacco: Never  Vaping Use   Vaping Use: Never used  Substance Use Topics   Alcohol use: Yes    Comment: rare   Drug use: Never    No current outpatient medications on file prior to visit.   No  current facility-administered medications on file prior to visit.     Review of Systems  Constitutional:  Positive for fatigue. Negative for fever.  HENT: Negative.    Eyes: Negative.   Respiratory:  Positive for shortness of breath (with exertion).   Cardiovascular: Negative.   Gastrointestinal:  Negative for constipation, diarrhea, nausea and vomiting.       Acid reflux  Genitourinary:  Positive for frequency. Negative for dysuria and urgency.  Musculoskeletal:  Positive for arthralgias (right knee).  Skin: Negative.   Neurological: Negative.   Psychiatric/Behavioral:  Negative for dysphoric mood. The patient is nervous/anxious.       Objective:    BP 132/80 (BP Location: Left Arm, Patient Position: Sitting, Cuff Size: Large)   Pulse 88   Temp 97.8 F (36.6 C) (Temporal)   Ht 5\' 7"  (1.702 m)   Wt (!) 317 lb (143.8 kg)   SpO2 97%   BMI 49.65 kg/m   Wt Readings from Last 3 Encounters:  10/30/21 (!) 317 lb (143.8 kg)  01/09/16 268 lb 6.4 oz (121.7 kg)  07/24/15 263 lb (119.3 kg)    BP Readings from Last 3 Encounters:  10/30/21 132/80  01/09/16 110/82  07/24/15 120/80    Physical Exam Vitals and nursing note reviewed.  Constitutional:      General: She is not in acute distress.    Appearance: Normal appearance. She is obese.  HENT:     Head: Normocephalic and atraumatic.     Right Ear: Tympanic membrane, ear canal and external ear normal.     Left Ear: Tympanic membrane, ear canal and external ear normal.  Eyes:     Conjunctiva/sclera: Conjunctivae normal.  Cardiovascular:     Rate and Rhythm: Normal rate and regular rhythm.     Pulses: Normal pulses.     Heart sounds: Normal heart sounds.  Pulmonary:     Effort: Pulmonary effort is normal.     Breath sounds: Normal breath sounds.  Abdominal:     Palpations: Abdomen is soft.     Tenderness: There is no abdominal tenderness.  Musculoskeletal:  General: Normal range of motion.     Cervical back: Normal  range of motion and neck supple.     Right lower leg: No edema.     Left lower leg: No edema.  Lymphadenopathy:     Cervical: No cervical adenopathy.  Skin:    General: Skin is warm and dry.  Neurological:     General: No focal deficit present.     Mental Status: She is alert and oriented to person, place, and time.     Cranial Nerves: No cranial nerve deficit.     Coordination: Coordination normal.     Gait: Gait normal.  Psychiatric:        Mood and Affect: Mood normal.        Behavior: Behavior normal.        Thought Content: Thought content normal.        Judgment: Judgment normal.        Assessment & Plan:   Problem List Items Addressed This Visit       Other   Pure hypercholesterolemia - Primary    Cholesterol levels are elevated with her total cholesterol being 255 and her LDL 179.  Discussed nutrition, exercise.  She states that she has a significant family history of heart disease, heart attacks, requiring stents.  She is very interested in a cardiac calcium score CT to see if this is affecting her at all.  With her prediabetes, obesity we will order this today.  She understands that this is not covered by insurance.  Follow-up in 3 months.      Relevant Orders   CT CARDIAC SCORING (SELF PAY ONLY)   Prediabetes    A1c is 6%.  She is working with a nutritionist through her work and tracking her foods.  Encouraged limiting carbohydrates and added sugars.  Information given on prediabetes.  Follow-up in 3 months.      Relevant Orders   CT CARDIAC SCORING (SELF PAY ONLY)   Morbid obesity (HCC)    BMI 49.6.  Discussed nutrition and exercise.  She is working with a Health and safety inspector through work.  Goal is to lose 1 to 2 pounds per week.      Chronic pain of right knee    Chronic, stable. She has a history of injury to her right knee requiring surgery. She now has arthritis and it not taking anything for pain. Follow-up if symptoms worsen or any concerns.       Anxiety     Chronic, ongoing.  She states that she is got a little bit more anxious and she is found out that she has prediabetes and high cholesterol.  Discussed getting at least 8 hours of sleep, exercise, meditation to help with anxiety.  She is also considering doing therapy through her work.  She is not interested in medication at this time.  Her PHQ-9 is a 6 and her GAD-7 is a 4.  Follow-up in 3 months.      Other Visit Diagnoses     Need for influenza vaccination       Flu vaccine given today   Relevant Orders   Flu Vaccine QUAD 79mo+IM (Fluarix, Fluzone & Alfiuria Quad PF) (Completed)        Follow up plan: Return in about 3 months (around 01/30/2022) for CPE.

## 2021-10-30 ENCOUNTER — Encounter: Payer: Self-pay | Admitting: Nurse Practitioner

## 2021-10-30 ENCOUNTER — Ambulatory Visit: Payer: BC Managed Care – PPO | Admitting: Nurse Practitioner

## 2021-10-30 VITALS — BP 132/80 | HR 88 | Temp 97.8°F | Ht 67.0 in | Wt 317.0 lb

## 2021-10-30 DIAGNOSIS — F419 Anxiety disorder, unspecified: Secondary | ICD-10-CM

## 2021-10-30 DIAGNOSIS — E7800 Pure hypercholesterolemia, unspecified: Secondary | ICD-10-CM | POA: Diagnosis not present

## 2021-10-30 DIAGNOSIS — M25561 Pain in right knee: Secondary | ICD-10-CM | POA: Diagnosis not present

## 2021-10-30 DIAGNOSIS — Z23 Encounter for immunization: Secondary | ICD-10-CM

## 2021-10-30 DIAGNOSIS — R7303 Prediabetes: Secondary | ICD-10-CM | POA: Diagnosis not present

## 2021-10-30 DIAGNOSIS — G8929 Other chronic pain: Secondary | ICD-10-CM | POA: Insufficient documentation

## 2021-10-30 NOTE — Assessment & Plan Note (Signed)
Cholesterol levels are elevated with her total cholesterol being 255 and her LDL 179.  Discussed nutrition, exercise.  She states that she has a significant family history of heart disease, heart attacks, requiring stents.  She is very interested in a cardiac calcium score CT to see if this is affecting her at all.  With her prediabetes, obesity we will order this today.  She understands that this is not covered by insurance.  Follow-up in 3 months.

## 2021-10-30 NOTE — Assessment & Plan Note (Signed)
A1c is 6%.  She is working with a nutritionist through her work and tracking her foods.  Encouraged limiting carbohydrates and added sugars.  Information given on prediabetes.  Follow-up in 3 months.

## 2021-10-30 NOTE — Assessment & Plan Note (Signed)
Chronic, stable. She has a history of injury to her right knee requiring surgery. She now has arthritis and it not taking anything for pain. Follow-up if symptoms worsen or any concerns.

## 2021-10-30 NOTE — Assessment & Plan Note (Signed)
Chronic, ongoing.  She states that she is got a little bit more anxious and she is found out that she has prediabetes and high cholesterol.  Discussed getting at least 8 hours of sleep, exercise, meditation to help with anxiety.  She is also considering doing therapy through her work.  She is not interested in medication at this time.  Her PHQ-9 is a 6 and her GAD-7 is a 4.  Follow-up in 3 months.

## 2021-10-30 NOTE — Patient Instructions (Signed)
It was great to see you!  You have developed a great plan for your nutrition and exercise.   Let's follow-up in 3 months, sooner if you have concerns.  If a referral was placed today, you will be contacted for an appointment. Please note that routine referrals can sometimes take up to 3-4 weeks to process. Please call our office if you haven't heard anything after this time frame.  Take care,  Rodman Pickle, NP

## 2021-10-30 NOTE — Assessment & Plan Note (Signed)
BMI 49.6.  Discussed nutrition and exercise.  She is working with a Health and safety inspector through work.  Goal is to lose 1 to 2 pounds per week.

## 2022-01-20 ENCOUNTER — Other Ambulatory Visit: Payer: BC Managed Care – PPO

## 2022-01-27 ENCOUNTER — Encounter: Payer: BC Managed Care – PPO | Admitting: Nurse Practitioner

## 2022-08-23 ENCOUNTER — Ambulatory Visit
Admission: RE | Admit: 2022-08-23 | Discharge: 2022-08-23 | Disposition: A | Discharge status: Home / Self Care | Payer: BC Managed Care – PPO | Source: Ambulatory Visit | Attending: Internal Medicine | Admitting: Internal Medicine

## 2022-08-23 VITALS — BP 169/113 | HR 84 | Temp 98.6°F | Resp 15

## 2022-08-23 DIAGNOSIS — R030 Elevated blood-pressure reading, without diagnosis of hypertension: Secondary | ICD-10-CM

## 2022-08-23 DIAGNOSIS — J3489 Other specified disorders of nose and nasal sinuses: Secondary | ICD-10-CM | POA: Diagnosis not present

## 2022-08-23 DIAGNOSIS — J019 Acute sinusitis, unspecified: Secondary | ICD-10-CM

## 2022-08-23 MED ORDER — GUAIFENESIN ER 600 MG PO TB12: 1200.0000 mg | ORAL_TABLET | Freq: Two times a day (BID) | ORAL | 0 refills | Status: AC

## 2022-08-23 MED ORDER — IBUPROFEN 600 MG PO TABS: 600.0000 mg | ORAL_TABLET | Freq: Four times a day (QID) | ORAL | 0 refills | Status: AC | PRN

## 2022-08-23 MED ORDER — IBUPROFEN 800 MG PO TABS
800.0000 mg | ORAL_TABLET | Freq: Once | ORAL | Status: AC
  Administered 2022-08-23: 800 mg via ORAL

## 2022-08-23 NOTE — ED Provider Notes (Signed)
[facility]    CSN: [id number] Arrival date & time: 08/23/22  1453      History   Chief Complaint Chief Complaint  Patient presents with   Facial Pain    Called [facility] on Friday about suspected sinus infection. Was prescribed antibiotics  doxycycline and told to see someone in person if the pain had not lessened after 3 days. It has not. - Entered by patient    HPI Donna Meyer is a 37 y.o. female.   Patient presents to urgent care for evaluation of nasal congestion and left maxillary sinus pain.  States she has had nasal congestion ever since she had COVID-19 3 weeks ago but just started with facial pain to the left maxillary sinus on Monday, August 16, 2022 (7 days ago).  She is experiencing green drainage from the nose and states congestion is worse at nighttime.  Pain to the left maxillary sinus is worse with palpation, is constant, and is described as sharp.  Pain is currently a 5 on a scale of 0-10 and radiates to the left upper teeth.  Reports she has dental decay to the front teeth and multiple missing teeth on the left upper aspect of the mouth but is not currently experiencing any significant dental pain.  States she initially had fever/chills as well as cough and headache with COVID-19 but the symptoms have subsided.  Coughs every once in a while but this is not bothersome to her.  Denies dizziness, chest pain, shortness of breath, nausea, vomiting, abdominal pain, rash, or decreased appetite.  She did a telehealth visit on Friday, August 20, 2022 (3 days ago) where she was prescribed doxycycline antibiotic for suspected sinus infection.  Telehealth medicine provider told her to seek care in person should her symptoms/facial pain persist/not improve in the next 2 to 3 days.  Pain has not improved very much over the last 3 days of taking doxycycline.  She is taking Tylenol for pain without much relief.  She also purchased a nasal lavage system today and plans to use this  when she goes home for the first time to clear her sinus congestion.  She was using Sudafed decongestant but stopped this when she started taking doxycycline antibiotic.  Denies history of frequent sinus infections.  No recent antibiotic/steroid use.   Of note, blood pressure is very elevated in clinic at 176/112.  Patient denies history of hypertension and does not currently take any antihypertensive medications.  No vision changes, chest pain, heart palpitations, extremity weakness, or paresthesias.  States she takes her blood pressure at home with her home cuff and gets normal readings.  States her blood pressure is "always high at the doctor" but normal at home.     Past Medical History:  Diagnosis Date   Allergy    Since i was a child   Arthritis    Asthma    as a child    Patient Active Problem List   Diagnosis Date Noted   Pure hypercholesterolemia 10/30/2021   Prediabetes 10/30/2021   Morbid obesity (HCC) 10/30/2021   Chronic pain of right knee 10/30/2021   Anxiety 10/30/2021    Past Surgical History:  Procedure Laterality Date   KNEE SURGERY Right 03/08/2002    OB History   No obstetric history on file.      Home Medications    Prior to Admission medications   Medication Sig Start Date End Date Taking? Authorizing Provider  guaiFENesin (MUCINEX) 600 MG 12 hr  tablet Take 2 tablets (1,200 mg total) by mouth 2 (two) times daily. 08/23/22  Yes Carlisle Beers, FNP  ibuprofen (ADVIL) 600 MG tablet Take 1 tablet (600 mg total) by mouth every 6 (six) hours as needed. 08/23/22  Yes Rodneshia Greenhouse, Donavan Burnet, FNP    Family History Family History  Problem Relation Age of Onset   Hyperlipidemia Mother    Cancer Mother        skin   Hypothyroidism Mother    Heart disease Father    Cancer Father        oral   Hyperlipidemia Sister    Heart disease Sister    ADD / ADHD Brother    Hyperlipidemia Maternal Aunt    Heart disease Maternal Aunt    Hyperlipidemia  Maternal Grandmother    Cancer Maternal Grandmother        unknown type   Heart disease Maternal Grandfather    Heart attack Maternal Grandfather    Cancer Paternal Grandmother        colon   Dementia Paternal Grandfather     Social History Social History   Tobacco Use   Smoking status: Never   Smokeless tobacco: Never  Vaping Use   Vaping Use: Never used  Substance Use Topics   Alcohol use: Yes    Comment: rare   Drug use: Never     Allergies   Penicillins, Hydrocodone-acetaminophen, and Vicodin [hydrocodone-acetaminophen]   Review of Systems Review of Systems Per HPI  Physical Exam Triage Vital Signs ED Triage Vitals  Enc Vitals Group     BP 08/23/22 1509 (!) 176/112     Pulse Rate 08/23/22 1509 82     Resp 08/23/22 1509 16     Temp 08/23/22 1509 98.6 F (37 C)     Temp Source 08/23/22 1509 Oral     SpO2 08/23/22 1509 96 %     Weight --      Height --      Head Circumference --      Peak Flow --      Pain Score 08/23/22 1511 5     Pain Loc --      Pain Edu? --      Excl. in GC? --    No data found.  Updated Vital Signs BP (!) 169/113 (BP Location: Left Arm)   Pulse 84   Temp 98.6 F (37 C) (Oral)   Resp 15   LMP 08/16/2022 (Exact Date)   SpO2 96%   Visual Acuity Right Eye Distance:   Left Eye Distance:   Bilateral Distance:    Right Eye Near:   Left Eye Near:    Bilateral Near:     Physical Exam Vitals and nursing note reviewed.  Constitutional:      Appearance: She is not ill-appearing or toxic-appearing.  HENT:     Head: Normocephalic and atraumatic.     Right Ear: Hearing, tympanic membrane, ear canal and external ear normal.     Left Ear: Hearing, tympanic membrane, ear canal and external ear normal.     Nose:     Left Sinus: Maxillary sinus tenderness present.      Comments: Diffuse tenderness to palpation of the maxillary sinus    Mouth/Throat:     Lips: Pink.     Mouth: Mucous membranes are moist. No injury.      Dentition: Abnormal dentition (decay to frontal teeth). Does not have dentures. No dental tenderness, gingival swelling or dental  caries.     Tongue: No lesions. Tongue does not deviate from midline.     Palate: No mass and lesions.     Pharynx: Oropharynx is clear. Uvula midline. No pharyngeal swelling, oropharyngeal exudate, posterior oropharyngeal erythema or uvula swelling.     Tonsils: No tonsillar exudate or tonsillar abscesses.  Eyes:     General: Lids are normal. Vision grossly intact. Gaze aligned appropriately.        Right eye: No discharge.        Left eye: No discharge.     Extraocular Movements: Extraocular movements intact.     Conjunctiva/sclera: Conjunctivae normal.  Cardiovascular:     Rate and Rhythm: Normal rate and regular rhythm.     Heart sounds: Normal heart sounds, S1 normal and S2 normal.  Pulmonary:     Effort: Pulmonary effort is normal. No respiratory distress.     Breath sounds: Normal breath sounds and air entry. No wheezing, rhonchi or rales.  Chest:     Chest wall: No tenderness.  Musculoskeletal:     Cervical back: Normal range of motion and neck supple. No tenderness.  Lymphadenopathy:     Cervical: No cervical adenopathy.  Skin:    General: Skin is warm and dry.     Capillary Refill: Capillary refill takes less than 2 seconds.     Findings: No rash.  Neurological:     General: No focal deficit present.     Mental Status: She is alert and oriented to person, place, and time. Mental status is at baseline.     Cranial Nerves: No dysarthria or facial asymmetry.  Psychiatric:        Mood and Affect: Mood normal.        Speech: Speech normal.        Behavior: Behavior normal.        Thought Content: Thought content normal.        Judgment: Judgment normal.      UC Treatments / Results  Labs (all labs ordered are listed, but only abnormal results are displayed) Labs Reviewed - No data to display  EKG   Radiology No results  found.  Procedures Procedures (including critical care time)  Medications Ordered in UC Medications  ibuprofen (ADVIL) tablet 800 mg (800 mg Oral Given 08/23/22 1551)    Initial Impression / Assessment and Plan / UC Course  I have reviewed the triage vital signs and the nursing notes.  Pertinent labs & imaging results that were available during my care of the patient were reviewed by me and considered in my medical decision making (see chart for details).   1. Acute maxillary sinusitis with symptoms > 10 days, pain of maxillary sinus Presentation is consistent with acute maxillary sinusitis.  Low suspicion for dental infection etiology.  Advised patient to continue taking doxycycline as prescribed for 10-day course until finished.  She has not been using ibuprofen, therefore ibuprofen 800 mg given in clinic.  May use 600 mg of ibuprofen every 6 hours as needed for pain and inflammation at home.  May continue taking Tylenol as needed at home as well.  Mucinex every 12 hours as needed for nasal congestion recommended as well as warm compresses.  She has had relief with warm compresses at home.  Lungs clear, therefore deferred imaging of the chest.  Overall well-appearing with hemodynamically stable vital signs despite elevated blood pressure.  2.  Elevated blood pressure reading in office without diagnosis of hypertension Blood pressure  reading is elevated in clinic at 176/112.  On recheck, blood pressure still elevated at 169/113.  Patient regularly checks her blood pressure at home and states the readings are in the 120s to 130s over 80s to 90s.  Advise follow-up with PCP for ongoing evaluation of elevated blood pressure readings.  DASH diet and increase exercise recommended.  She is agreeable plan.  No red flag signs or symptoms indicating need for referral to the emergency department for further workup related to elevated blood pressure.  Discussed physical exam and available lab work findings  in clinic with patient.  Counseled patient regarding appropriate use of medications and potential side effects for all medications recommended or prescribed today. Discussed red flag signs and symptoms of worsening condition,when to call the PCP office, return to urgent care, and when to seek higher level of care in the emergency department. Patient verbalizes understanding and agreement with plan. All questions answered. Patient discharged in stable condition.    Final Clinical Impressions(s) / UC Diagnoses   Final diagnoses:  Acute sinusitis with symptoms > 10 days  Pain of maxillary sinus  Elevated blood pressure reading in office without diagnosis of hypertension     Discharge Instructions      Continue doxycycline antibiotic to treat sinus infection.  Mucinex every 12 hours as needed for nasal congestion.  Warm compresses continued.  Ibuprofen 600 mg every 6 hours as needed for pain and inflammation.  Tylenol 1000 mg every 6 hours as needed for aches and pains.  If you develop any new or worsening symptoms or do not improve in the next 2 to 3 days, please return.  If your symptoms are severe, please go to the emergency room.  Follow-up with your primary care provider for further evaluation and management of your symptoms as well as ongoing wellness visits.  I hope you feel better!     ED Prescriptions     Medication Sig Dispense Auth. Provider   ibuprofen (ADVIL) 600 MG tablet Take 1 tablet (600 mg total) by mouth every 6 (six) hours as needed. 30 tablet Carlisle Beers, FNP   guaiFENesin (MUCINEX) 600 MG 12 hr tablet Take 2 tablets (1,200 mg total) by mouth 2 (two) times daily. 30 tablet Carlisle Beers, FNP      PDMP not reviewed this encounter.   Carlisle Beers, [place] 08/23/22 1601

## 2022-08-23 NOTE — ED Triage Notes (Signed)
Pt c/o facial pain, pt was seen by [facility] prescribed doxycycline and told if pain didn't get better she would need to be seen in person. Pt was seen on Friday took meds through Sunday with no relief from pain

## 2022-08-23 NOTE — Discharge Instructions (Addendum)
Continue doxycycline antibiotic to treat sinus infection.  Mucinex every 12 hours as needed for nasal congestion.  Warm compresses continued.  Ibuprofen 600 mg every 6 hours as needed for pain and inflammation.  Tylenol 1000 mg every 6 hours as needed for aches and pains.  If you develop any new or worsening symptoms or do not improve in the next 2 to 3 days, please return.  If your symptoms are severe, please go to the emergency room.  Follow-up with your primary care provider for further evaluation and management of your symptoms as well as ongoing wellness visits.  I hope you feel better!
